# Patient Record
Sex: Male | Born: 1940 | Race: Black or African American | Hispanic: No | Marital: Married | Smoking: Former smoker
Health system: Southern US, Community
[De-identification: ages and names within clinical notes are randomized; demographics above are authoritative.]

## PROBLEM LIST (undated history)

## (undated) DIAGNOSIS — J449 Chronic obstructive pulmonary disease, unspecified: Secondary | ICD-10-CM

## (undated) DIAGNOSIS — C3491 Malignant neoplasm of unspecified part of right bronchus or lung: Principal | ICD-10-CM

## (undated) DIAGNOSIS — Z8719 Personal history of other diseases of the digestive system: Secondary | ICD-10-CM

## (undated) DIAGNOSIS — G4730 Sleep apnea, unspecified: Secondary | ICD-10-CM

## (undated) DIAGNOSIS — C801 Malignant (primary) neoplasm, unspecified: Secondary | ICD-10-CM

## (undated) DIAGNOSIS — Z9889 Other specified postprocedural states: Secondary | ICD-10-CM

## (undated) HISTORY — DX: Other specified postprocedural states: Z98.890

## (undated) HISTORY — PX: HYDROCELE EXCISION: SHX482

## (undated) HISTORY — DX: Personal history of other diseases of the digestive system: Z87.19

## (undated) HISTORY — PX: HERNIA REPAIR: SHX51

## (undated) HISTORY — DX: Chronic obstructive pulmonary disease, unspecified: J44.9

## (undated) HISTORY — DX: Malignant neoplasm of unspecified part of right bronchus or lung: C34.91

## (undated) HISTORY — PX: APPENDECTOMY: SHX54

## (undated) HISTORY — DX: Sleep apnea, unspecified: G47.30

---

## 2004-07-06 ENCOUNTER — Ambulatory Visit (HOSPITAL_COMMUNITY): Admission: RE | Admit: 2004-07-06 | Discharge: 2004-07-06 | Payer: Self-pay | Admitting: General Surgery

## 2007-07-14 ENCOUNTER — Ambulatory Visit: Payer: Self-pay | Admitting: Sports Medicine

## 2007-07-14 DIAGNOSIS — M533 Sacrococcygeal disorders, not elsewhere classified: Secondary | ICD-10-CM | POA: Insufficient documentation

## 2010-10-05 NOTE — Op Note (Signed)
NAMEYAFET, CLINE NO.:  [id number]   MEDICAL RECORD NO.:  [id number]          PATIENT TYPE:  OIB   LOCATION:  2899                         FACILITY:  [facility]   PHYSICIAN:  Leonie Man, M.D.   DATE OF BIRTH:  September 02, 1940   DATE OF PROCEDURE:  07/06/2004  DATE OF DISCHARGE:                                 OPERATIVE REPORT   PREOPERATIVE DIAGNOSIS:  Recurrent left inguinal hernia.   POSTOPERATIVE DIAGNOSIS:  Recurrent left inguinal hernia.   PROCEDURE:  Repair of left inguinal hernia, herniorrhaphy with mesh.   SURGEON:  Leonie Man, M.D.   ASSISTANT:  OR tech.   ANESTHESIA:  General.   The patient is a 70 year old gentleman who underwent a Shouldice-type  herniorrhaphy approximately 10 years ago. He returns now with recurrent  inguinal hernia and comes to the operating room for repair after the risks  and potential benefits of surgery have been fully discussed with him. He  understands and accepts these risks and gives consent surgery.   PROCEDURE:  Following induction of satisfactory general anesthesia, the  patient is positioned supinely. The abdomen is prepped and draped to be  included in the sterile operative field.  I infiltrated the left lower groin  crease with 0.5% Marcaine with epinephrine. A transverse incision in the old  scar cicatrix was carried down through skin, subcutaneous tissue dissecting  down to the external oblique aponeurosis.  This was clear down to the  external inguinal ring and the external inguinal ring is opened up to reveal  the spermatic cord. The spermatic cord was dissected free from the floor of  the inguinal canal and held with a Penrose drain.  The area of herniation  was at the internal ring. Where a large hernia was noted. This was reduced  back into the retroperitoneum and I placed a Bard plug of medium sized into  this defect and sutured it to the transversalis fascia. This was then  covered with an overlay  patch of polypropylene mesh which was sewn in at the  pubic tubercle, carried up along the conjoined tendon to the internal ring  and again from the pubic tubercle up along the shelving edge of Poupart's  ligament to the internal ring. The tails of mesh were trimmed and sutured  into this internal oblique muscle behind the cord. The hernia repair was  noted to be intact. Sponge and instrument counts were verified and the  external oblique aponeurosis was closed over the cord so as to reapproximate  the external ring. This was done with a 2-0 Vicryl suture. The subcutaneous  tissues and Scarpa's  fascia was then closed with 3-0 Vicryl suture after  sponge and instrument counts were verified.  The skin was closed with a running 4-0 Monocryl suture and then reinforced  Steri-Strips and sterile dressing was applied. The anesthetic reversed and  the patient removed from the operating room to the recovery room in stable  condition.  He tolerated the procedure well.      PB/MEDQ  D:  07/06/2004  T:  07/06/2004  Job:  [id number] 

## 2013-06-28 ENCOUNTER — Other Ambulatory Visit: Payer: Self-pay | Admitting: Internal Medicine

## 2013-06-28 DIAGNOSIS — J449 Chronic obstructive pulmonary disease, unspecified: Secondary | ICD-10-CM | POA: Diagnosis not present

## 2013-06-28 DIAGNOSIS — R05 Cough: Secondary | ICD-10-CM | POA: Diagnosis not present

## 2013-06-28 DIAGNOSIS — F17200 Nicotine dependence, unspecified, uncomplicated: Secondary | ICD-10-CM | POA: Diagnosis not present

## 2013-06-28 DIAGNOSIS — R059 Cough, unspecified: Secondary | ICD-10-CM | POA: Diagnosis not present

## 2013-06-28 DIAGNOSIS — Z23 Encounter for immunization: Secondary | ICD-10-CM | POA: Diagnosis not present

## 2013-07-02 ENCOUNTER — Ambulatory Visit
Admission: RE | Admit: 2013-07-02 | Discharge: 2013-07-02 | Disposition: A | Discharge status: Home / Self Care | Payer: Medicare Other | Source: Ambulatory Visit | Attending: Internal Medicine | Admitting: Internal Medicine

## 2013-07-02 DIAGNOSIS — J449 Chronic obstructive pulmonary disease, unspecified: Secondary | ICD-10-CM | POA: Diagnosis not present

## 2013-07-02 MED ORDER — IOHEXOL 300 MG/ML  SOLN
75.0000 mL | Freq: Once | INTRAMUSCULAR | Status: AC | PRN
  Administered 2013-07-02: 75 mL via INTRAVENOUS

## 2013-09-01 DIAGNOSIS — Z125 Encounter for screening for malignant neoplasm of prostate: Secondary | ICD-10-CM | POA: Diagnosis not present

## 2013-09-01 DIAGNOSIS — R911 Solitary pulmonary nodule: Secondary | ICD-10-CM | POA: Diagnosis not present

## 2013-09-01 DIAGNOSIS — Z0000 Encounter for general adult medical examination without abnormal findings: Secondary | ICD-10-CM | POA: Diagnosis not present

## 2013-09-01 DIAGNOSIS — Z23 Encounter for immunization: Secondary | ICD-10-CM | POA: Diagnosis not present

## 2013-09-01 DIAGNOSIS — Z1331 Encounter for screening for depression: Secondary | ICD-10-CM | POA: Diagnosis not present

## 2013-09-01 DIAGNOSIS — J438 Other emphysema: Secondary | ICD-10-CM | POA: Diagnosis not present

## 2013-09-01 DIAGNOSIS — F17200 Nicotine dependence, unspecified, uncomplicated: Secondary | ICD-10-CM | POA: Diagnosis not present

## 2013-10-14 ENCOUNTER — Other Ambulatory Visit: Payer: Self-pay | Admitting: Internal Medicine

## 2013-10-14 DIAGNOSIS — Z139 Encounter for screening, unspecified: Secondary | ICD-10-CM

## 2013-10-22 ENCOUNTER — Ambulatory Visit: Payer: Medicare Other

## 2013-10-26 ENCOUNTER — Ambulatory Visit
Admission: RE | Admit: 2013-10-26 | Discharge: 2013-10-26 | Disposition: A | Discharge status: Home / Self Care | Payer: Medicare Other | Source: Ambulatory Visit | Attending: Internal Medicine | Admitting: Internal Medicine

## 2013-10-26 ENCOUNTER — Ambulatory Visit: Payer: Medicare Other

## 2013-10-26 DIAGNOSIS — I708 Atherosclerosis of other arteries: Secondary | ICD-10-CM | POA: Diagnosis not present

## 2013-10-26 DIAGNOSIS — Z139 Encounter for screening, unspecified: Secondary | ICD-10-CM

## 2014-01-28 DIAGNOSIS — R209 Unspecified disturbances of skin sensation: Secondary | ICD-10-CM | POA: Diagnosis not present

## 2014-01-28 DIAGNOSIS — F17200 Nicotine dependence, unspecified, uncomplicated: Secondary | ICD-10-CM | POA: Diagnosis not present

## 2014-01-28 DIAGNOSIS — R5381 Other malaise: Secondary | ICD-10-CM | POA: Diagnosis not present

## 2014-01-28 DIAGNOSIS — T1490XA Injury, unspecified, initial encounter: Secondary | ICD-10-CM | POA: Diagnosis not present

## 2014-01-28 DIAGNOSIS — R5383 Other fatigue: Secondary | ICD-10-CM | POA: Diagnosis not present

## 2014-01-28 DIAGNOSIS — G5630 Lesion of radial nerve, unspecified upper limb: Secondary | ICD-10-CM | POA: Diagnosis not present

## 2014-06-25 DIAGNOSIS — J069 Acute upper respiratory infection, unspecified: Secondary | ICD-10-CM | POA: Diagnosis not present

## 2015-06-02 DIAGNOSIS — R350 Frequency of micturition: Secondary | ICD-10-CM | POA: Diagnosis not present

## 2015-06-02 DIAGNOSIS — J441 Chronic obstructive pulmonary disease with (acute) exacerbation: Secondary | ICD-10-CM | POA: Diagnosis not present

## 2015-06-02 DIAGNOSIS — R21 Rash and other nonspecific skin eruption: Secondary | ICD-10-CM | POA: Diagnosis not present

## 2016-10-22 ENCOUNTER — Other Ambulatory Visit: Payer: Self-pay | Admitting: Internal Medicine

## 2016-10-22 ENCOUNTER — Ambulatory Visit
Admission: RE | Admit: 2016-10-22 | Discharge: 2016-10-22 | Disposition: A | Discharge status: Home / Self Care | Payer: Medicare Other | Source: Ambulatory Visit | Attending: Internal Medicine | Admitting: Internal Medicine

## 2016-10-22 DIAGNOSIS — R042 Hemoptysis: Secondary | ICD-10-CM | POA: Diagnosis not present

## 2016-10-22 DIAGNOSIS — J449 Chronic obstructive pulmonary disease, unspecified: Secondary | ICD-10-CM

## 2016-10-22 DIAGNOSIS — R911 Solitary pulmonary nodule: Secondary | ICD-10-CM

## 2016-10-22 DIAGNOSIS — J90 Pleural effusion, not elsewhere classified: Secondary | ICD-10-CM | POA: Diagnosis not present

## 2016-10-22 MED ORDER — IOPAMIDOL (ISOVUE-300) INJECTION 61%
75.0000 mL | Freq: Once | INTRAVENOUS | Status: AC | PRN
  Administered 2016-10-22: 75 mL via INTRAVENOUS

## 2016-10-23 DIAGNOSIS — J449 Chronic obstructive pulmonary disease, unspecified: Secondary | ICD-10-CM | POA: Diagnosis not present

## 2016-10-23 DIAGNOSIS — R042 Hemoptysis: Secondary | ICD-10-CM | POA: Diagnosis not present

## 2016-10-23 DIAGNOSIS — R918 Other nonspecific abnormal finding of lung field: Secondary | ICD-10-CM | POA: Diagnosis not present

## 2016-11-07 ENCOUNTER — Telehealth: Payer: Self-pay | Admitting: *Deleted

## 2016-11-07 NOTE — Telephone Encounter (Signed)
Oncology Nurse Navigator Documentation  Oncology Nurse Navigator Flowsheets 11/07/2016  Navigator Location [facility]  Referral date to RadOnc/MedOnc 11/07/2016  Navigator Encounter Type Telephone/I updated Dr. Julien Nordmann on referral.  He asked that I call the patient to be scheduled on 11/19/16.  I called and left a vm message with my name and phone number to call.   Telephone Outgoing Call  Treatment Phase Abnormal Scans  Barriers/Navigation Needs Coordination of Care  Interventions Coordination of Care  Coordination of Care Appts  Acuity Level 1  Time Spent with Patient 15

## 2016-11-11 ENCOUNTER — Telehealth: Payer: Self-pay | Admitting: *Deleted

## 2016-11-11 DIAGNOSIS — R918 Other nonspecific abnormal finding of lung field: Secondary | ICD-10-CM | POA: Insufficient documentation

## 2016-11-11 NOTE — Telephone Encounter (Signed)
Oncology Nurse Navigator Documentation  Oncology Nurse Navigator Flowsheets 11/11/2016  Navigator Location [facility]  Navigator Encounter Type Telephone/I received a call back. I updated on appt with Dr. Julien Nordmann.   Telephone Incoming Call  Treatment Phase Abnormal Scans  Barriers/Navigation Needs Coordination of Care  Interventions Coordination of Care  Coordination of Care Appts  Acuity Level 1  Time Spent with Patient 15

## 2016-11-11 NOTE — Telephone Encounter (Signed)
Oncology Nurse Navigator Documentation  Oncology Nurse Navigator Flowsheets 11/11/2016  Navigator Location [facility]  Navigator Encounter Type Telephone/I received a call from Mr. Hoh.  I called him back today and was unable to reach him.  I left vm message with my name and phone number to call.   Telephone Incoming Call;Outgoing Call  Treatment Phase Abnormal Scans  Barriers/Navigation Needs Coordination of Care  Interventions Coordination of Care  Coordination of Care Appts  Acuity Level 1  Time Spent with Patient 15

## 2016-11-18 DIAGNOSIS — R918 Other nonspecific abnormal finding of lung field: Secondary | ICD-10-CM | POA: Diagnosis not present

## 2016-11-18 DIAGNOSIS — G4733 Obstructive sleep apnea (adult) (pediatric): Secondary | ICD-10-CM | POA: Diagnosis not present

## 2016-11-19 ENCOUNTER — Telehealth: Payer: Self-pay | Admitting: Internal Medicine

## 2016-11-19 ENCOUNTER — Ambulatory Visit (HOSPITAL_BASED_OUTPATIENT_CLINIC_OR_DEPARTMENT_OTHER): Payer: Medicare Other | Admitting: Internal Medicine

## 2016-11-19 ENCOUNTER — Encounter: Payer: Self-pay | Admitting: Internal Medicine

## 2016-11-19 ENCOUNTER — Encounter: Payer: Self-pay | Admitting: *Deleted

## 2016-11-19 ENCOUNTER — Other Ambulatory Visit (HOSPITAL_BASED_OUTPATIENT_CLINIC_OR_DEPARTMENT_OTHER): Payer: Medicare Other

## 2016-11-19 VITALS — BP 120/69 | HR 82 | Temp 97.8°F | Resp 19 | Ht 73.0 in | Wt 140.3 lb

## 2016-11-19 DIAGNOSIS — R918 Other nonspecific abnormal finding of lung field: Secondary | ICD-10-CM | POA: Diagnosis not present

## 2016-11-19 DIAGNOSIS — R042 Hemoptysis: Secondary | ICD-10-CM

## 2016-11-19 LAB — COMPREHENSIVE METABOLIC PANEL
ALBUMIN: 3.5 g/dL (ref 3.5–5.0)
ALK PHOS: 87 U/L (ref 40–150)
ALT: 6 U/L (ref 0–55)
ANION GAP: 9 meq/L (ref 3–11)
AST: 15 U/L (ref 5–34)
BILIRUBIN TOTAL: 0.32 mg/dL (ref 0.20–1.20)
BUN: 13.5 mg/dL (ref 7.0–26.0)
CALCIUM: 10 mg/dL (ref 8.4–10.4)
CO2: 28 mEq/L (ref 22–29)
CREATININE: 0.9 mg/dL (ref 0.7–1.3)
Chloride: 103 mEq/L (ref 98–109)
EGFR: >90 mL/min/{1.73_m2} (ref >90)
Glucose: 86 mg/dl (ref 70–140)
Potassium: 4.8 mEq/L (ref 3.5–5.1)
Sodium: 141 mEq/L (ref 136–145)
TOTAL PROTEIN: 8.2 g/dL (ref 6.4–8.3)

## 2016-11-19 LAB — CBC WITH DIFFERENTIAL/PLATELET
BASO%: 0.4 % (ref 0.0–2.0)
Basophils Absolute: 0 10*3/uL (ref 0.0–0.1)
EOS%: 3.8 % (ref 0.0–7.0)
Eosinophils Absolute: 0.4 10*3/uL (ref 0.0–0.5)
HEMATOCRIT: 41.3 % (ref 38.4–49.9)
HEMOGLOBIN: 13.7 g/dL (ref 13.0–17.1)
LYMPH#: 2 10*3/uL (ref 0.9–3.3)
LYMPH%: 17.7 % (ref 14.0–49.0)
MCH: 29.7 pg (ref 27.2–33.4)
MCHC: 33.2 g/dL (ref 32.0–36.0)
MCV: 89.6 fL (ref 79.3–98.0)
MONO#: 1.1 10*3/uL — AB (ref 0.1–0.9)
MONO%: 9.8 % (ref 0.0–14.0)
NEUT#: 7.6 10*3/uL — ABNORMAL HIGH (ref 1.5–6.5)
NEUT%: 68.3 % (ref 39.0–75.0)
PLATELETS: 368 10*3/uL (ref 140–400)
RBC: 4.61 10*6/uL (ref 4.20–5.82)
RDW: 13.3 % (ref 11.0–14.6)
WBC: 11.1 10*3/uL — ABNORMAL HIGH (ref 4.0–10.3)

## 2016-11-19 NOTE — Telephone Encounter (Signed)
Scheduled appt per 7/3 los - Gave patient AVS and calender per los.  

## 2016-11-19 NOTE — Progress Notes (Signed)
Oncology Nurse Navigator Documentation  Oncology Nurse Navigator Flowsheets 11/19/2016  Navigator Location [facility]  Navigator Encounter Type Other/per Dr. Julien Nordmann, I contacted [facility] to updated them on referral.  Treatment Phase Abnormal Scans  Barriers/Navigation Needs Coordination of Care  Interventions Coordination of Care  Coordination of Care Other  Acuity Level 1  Time Spent with Patient 15

## 2016-11-19 NOTE — Progress Notes (Signed)
[place] [phone]   [phone]  CONSULT NOTE  REFERRING PHYSICIAN: Dr. Jori Allen polite  REASON FOR CONSULTATION:  76 years old African-American male with highly suspicious lung cancer  HPI Harry Allen is a 76 y.o. male with past medical history significant for COPD, sleep apnea as well as hernia repair. The patient also has a long history of smoking but quit in 2015. The patient has been complaining of chronic cough with blood-tinged sputum since early May 2018. He was seen by his primary care physician at that time Dr. Delfina Allen. He ordered CT scan of the chest which was performed on 10/22/2016 and it showed a mass in the superior segment of the right lower lobe which surrounds the superior segment of the right lower lobe bronchus and potentially may invade this bronchus. This mass measured 6.4 x 5.3 x 5.2 cm and extends into and invades the right hilum. There may be adjacent hilar adenopathy. There was also a nodular lesion in the apical segment of the right upper lobe measuring 1.2 x 0.8 cm. This scan also showed adenopathy anterior to the right main bronchus measuring 2.0 x 1.6 cm. There was a prominent lymph node immediately anterior to the carina measuring 1.6 x 1.1 cm and then maybe hilar adenopathy. The patient has been traveling between [place] and [place] for few weeks for some business. He was referred to the clinic today by Dr. Delfina Allen for evaluation of his condition. In the meantime he changed his primary care physician and he is currently seeing Dr. Melford Allen. When seen today he continues to complain of generalized weakness as well as shortness breath with exertion and cough productive of blood-tinged sputum but no significant chest pain. He denied having any weight loss or night sweats. He has no headache or visual changes. He has no nausea, vomiting, diarrhea or constipation. Family history significant for mother died from old age at 65 and  father had cancer in his 32s. Brother and sister had heart disease. The patient is married and has no children. He was accompanied today by his wife Harry Allen. Is currently retired and used to Retail buyer at [employer]. The patient has a history of smoking 1 pack per day for around 50 years and quit 3 years ago. He also drinks alcohol occasionally and no history of drug abuse.  HPI  Past Medical History:  Diagnosis Date  . COPD (chronic obstructive pulmonary disease) ([place])   . H/O hernia repair   . Sleep apnea     History reviewed. No pertinent surgical history.  Family History  Problem Relation Age of Onset  . Cancer Father     Social History Social History  Substance Use Topics  . Smoking status: Former Smoker    Packs/day: 1.00    Years: 8.00    Quit date: 11/19/2013  . Smokeless tobacco: Never Used  . Alcohol use Not on file    Not on File  Current Outpatient Prescriptions  Medication Sig Dispense Refill  . omeprazole (PRILOSEC) 20 MG capsule Take by mouth.     No current facility-administered medications for this visit.     Review of Systems  Constitutional: positive for fatigue Eyes: negative Ears, nose, mouth, throat, and face: negative Respiratory: positive for cough, dyspnea on exertion, hemoptysis and sputum Cardiovascular: negative Gastrointestinal: negative Genitourinary:negative Integument/breast: negative Hematologic/lymphatic: negative Musculoskeletal:negative Neurological: negative Behavioral/Psych: negative Endocrine: negative Allergic/Immunologic: negative  Physical Exam  XUX:YBFXO, healthy, no distress, well nourished, well developed and  anxious SKIN: skin color, texture, turgor are normal, no rashes or significant lesions HEAD: Normocephalic, No masses, lesions, tenderness or abnormalities EYES: normal, PERRLA, Conjunctiva are pink and non-injected EARS: External ears normal, Canals clear OROPHARYNX:no exudate, no erythema and lips, buccal  mucosa, and tongue normal  NECK: supple, no adenopathy, no JVD LYMPH:  no palpable lymphadenopathy, no hepatosplenomegaly LUNGS: clear to auscultation , and palpation HEART: regular rate & rhythm, no murmurs and no gallops ABDOMEN:abdomen soft, non-tender, normal bowel sounds and no masses or organomegaly BACK: Back symmetric, no curvature., No CVA tenderness EXTREMITIES:no joint deformities, effusion, or inflammation, no edema, no skin discoloration  NEURO: alert & oriented x 3 with fluent speech, no focal motor/sensory deficits  PERFORMANCE STATUS: ECOG 1  LABORATORY DATA: Lab Results  Component Value Date   WBC 11.1 (H) 11/19/2016   HGB 13.7 11/19/2016   HCT 41.3 11/19/2016   MCV 89.6 11/19/2016   PLT 368 11/19/2016      Chemistry      Component Value Date/Time   NA 141 11/19/2016 1344   K 4.8 11/19/2016 1344   CO2 28 11/19/2016 1344   BUN 13.5 11/19/2016 1344   CREATININE 0.9 11/19/2016 1344      Component Value Date/Time   CALCIUM 10.0 11/19/2016 1344   ALKPHOS 87 11/19/2016 1344   AST 15 11/19/2016 1344   ALT 6 11/19/2016 1344   BILITOT 0.32 11/19/2016 1344       RADIOGRAPHIC STUDIES: Ct Chest W Contrast  Result Date: 10/22/2016 CLINICAL DATA:  Chronic hemoptysis.  Previous pulmonary nodule EXAM: CT CHEST WITH CONTRAST TECHNIQUE: Multidetector CT imaging of the chest was performed during intravenous contrast administration. CONTRAST:  12mL ISOVUE-300 IOPAMIDOL (ISOVUE-300) INJECTION 61% COMPARISON:  July 02, 2013 FINDINGS: Cardiovascular: There is no appreciable thoracic aortic aneurysm or dissection. There is atherosclerotic calcification, moderate, at the origin of the left subclavian artery. Milder atherosclerotic calcification is noted in the proximal left and right common carotid arteries. Visualized great vessels otherwise appear unremarkable. There are scattered foci of atherosclerotic calcification in the aorta as well as multiple foci of coronary artery  calcification. Pericardium is not appreciably thickened. There is no major vessel pulmonary embolus. Mediastinum/Nodes: Thyroid appears unremarkable. There is adenopathy anterior to the right main bronchus measuring 2.0 x 1.6 cm. There is a prominent lymph node immediately anterior to the carina measuring 1.6 x 1.1 cm. There may be hilar adenopathy indistinguishable from adjacent mass. There is a small hiatal hernia. Lungs/Pleura: There is a mass in the superior segment of the right lower lobe which surrounds the superior segment right lower lobe bronchus and potentially may invade this bronchus. This mass, which measures 6.4 x 5.3 x 5.2 cm, extends into and invades the right hilum. There may be adjacent hilar adenopathy. There is underlying centrilobular emphysematous change. There is a nodular lesion in the apical segment of the right upper lobe measuring 1.2 x 0.8 cm, not present on prior study. There are scattered areas of scarring throughout the lungs bilaterally. Bullae are noted in the apices, more pronounced on the right than on the left. There is no pleural effusion or pleural thickening. Upper Abdomen: No adrenal lesions are evident. There is a cyst arising from the periphery of the mid right kidney measuring 2.9 x 2.8 cm. There is aortic atherosclerosis in the upper abdominal region. Visualized upper abdominal structures otherwise appear unremarkable. Musculoskeletal: There are no blastic or lytic bone lesions. 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 IMPRESSION: Mass arising in the superior  segment right lower lobe extending into and likely invading the right hilum measuring 6.4 x 5.3 x 5.2 cm. Smaller mass, a likely metastasis, in the apical segment of the right upper lobe measuring 1.2 x 0.8 cm. Adenopathy anterior to the carina and anterior right main bronchus. There is probable hilar adenopathy on the right indistinguishable from the mass which is invading the right hilum. Underlying emphysema. Areas of  bullous disease in the apices. Scattered areas of scarring bilaterally. Areas of atherosclerotic calcification including foci of coronary artery calcification. Nuclear medicine PET study could be helpful for staging in this circumstance. These results will be called to the ordering clinician or representative by the Radiologist Assistant, and communication documented in the PACS or zVision Dashboard. Electronically Signed   By: Lowella Grip III M.D.   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   PLAN: I had a lengthy discussion with the patient and his wife today about his current disease stage, prognosis and treatment options. I personally and independently reviewed the scan images and discuss the results and showed the images to the patient and his wife today. I recommended for the patient to complete the staging workup by ordering a PET scan as well as MRI of the brain to rule out any other metastatic disease. I will also refer the patient to cardiothoracic surgery for consideration of bronchoscopy with endobronchial ultrasound and biopsy for tissue diagnosis and also to evaluate the source of his hemoptysis. I will see the patient back for follow-up visit in 2 weeks for reevaluation and more detailed discussion of his treatment options based on the final staging workup and tissue diagnosis. We will send the tissue biopsy for molecular studies based on the final pathology. The patient was advised to call immediately if she has any concerning symptoms in the interval. The patient voices understanding of current disease status and treatment options and is in agreement with the current care plan. All  questions were answered. The patient knows to call the clinic with any problems, questions or concerns. We can certainly see the patient much sooner if necessary.  Thank you so much for allowing me to participate in the care of [facility]. I will continue to follow up the patient with you and assist in his care.  I spent 40 minutes counseling the patient face to face. The total time spent in the appointment was 60 minutes.  Disclaimer: This note was dictated with voice recognition software. Similar sounding words can inadvertently be transcribed and may not be corrected upon review.   Deshon Koslowski K. November 19, 2016, 2:32 PM

## 2016-11-29 ENCOUNTER — Other Ambulatory Visit: Payer: Self-pay | Admitting: *Deleted

## 2016-11-29 ENCOUNTER — Institutional Professional Consult (permissible substitution) (INDEPENDENT_AMBULATORY_CARE_PROVIDER_SITE_OTHER): Payer: Medicare Other | Admitting: Thoracic Surgery (Cardiothoracic Vascular Surgery)

## 2016-11-29 ENCOUNTER — Encounter: Payer: Self-pay | Admitting: Thoracic Surgery (Cardiothoracic Vascular Surgery)

## 2016-11-29 VITALS — BP 126/76 | HR 68 | Resp 20 | Ht 72.0 in | Wt 142.0 lb

## 2016-11-29 DIAGNOSIS — R918 Other nonspecific abnormal finding of lung field: Secondary | ICD-10-CM

## 2016-11-29 NOTE — Progress Notes (Signed)
PCP is [name], MD Referring Provider is Curt Bears, MD  Chief Complaint  Patient presents with  . Lung Lesion    surgical eval for tissue BX, Chest CT 10/22/16 PET Scan and MR Brain scheduled for 12/05/16    HPI: Mr. Ybarra is a 76 year old gentleman who presents with chief complaint of hemoptysis.  Mr. Zobrist is a 76 year old man with a 40+-pack-year history of tobacco abuse and self diagnosed COPD. He also has a history of sleep apnea but does not use CPAP because it is cumbersome. About a month and a half ago he started coughing up small amounts of blood. She did not seek medical attention initially but when his wife found out she insisted that he see his doctor. A CT of the chest was done on 10/22/2016. It showed a large mass in the superior segment of the right lower lobe possibly invading the hilum. There also was a smaller right upper lobe nodule. Hilar or mediastinal adenopathy was noted as well.  He has not had any change in appetite but has lost 3 pounds over the past 3 months. He gets short of breath with heavy exertion but rides a bike on a regular basis. He self medicates for heartburn with Prilosec. He takes that once every couple of weeks. He has not had any chest pain, pressure, or tightness. He denies headaches or visual changes.  Zubrod Score: At the time of surgery this patient's most appropriate activity status/level should be described as: [x]     0    Normal activity, no symptoms []     1    Restricted in physical strenuous activity but ambulatory, able to do out light work []     2    Ambulatory and capable of self care, unable to do work activities, up and about >50 % of waking hours                              []     3    Only limited self care, in bed greater than 50% of waking hours []     4    Completely disabled, no self care, confined to bed or chair []     5    Moribund  Past Medical History:  Diagnosis Date  . COPD (chronic obstructive pulmonary disease)  ([place])   . H/O hernia repair   . Sleep apnea     Past Surgical History:  Procedure Laterality Date  . APPENDECTOMY    . HERNIA REPAIR      Family History  Problem Relation Age of Onset  . Cancer Father     Social History Social History  Substance Use Topics  . Smoking status: Former Smoker    Packs/day: 1.00    Years: 8.00    Quit date: 11/19/2013  . Smokeless tobacco: Never Used  . Alcohol use Not on file    Current Outpatient Prescriptions  Medication Sig Dispense Refill  . omeprazole (PRILOSEC) 20 MG capsule Take by mouth.     No current facility-administered medications for this visit.     No Known Allergies  Review of Systems  Constitutional: Positive for unexpected weight change (Has lost 3 pounds in 3 months). Negative for activity change.  HENT: Negative for trouble swallowing and voice change.   Respiratory: Positive for apnea, cough (Hemoptysis) and shortness of breath (Heavy exertion).   Gastrointestinal: Positive for abdominal pain (Occasional reflux). Negative for abdominal  distention and blood in stool.  Genitourinary: Positive for frequency. Negative for dysuria.  Musculoskeletal: Negative for arthralgias, back pain and gait problem.  Neurological: Negative for dizziness, syncope and weakness.  Hematological: Negative for adenopathy. Does not bruise/bleed easily.  All other systems reviewed and are negative.   BP 126/76   Pulse 68   Resp 20   Ht 6' (1.829 m)   Wt 142 lb (64.4 kg)   SpO2 94% Comment: RA  BMI 19.26 kg/m  Physical Exam  Constitutional: He is oriented to person, place, and time. He appears well-developed and well-nourished. No distress.  HENT:  Head: Normocephalic and atraumatic.  Mouth/Throat: No oropharyngeal exudate.  Eyes: Conjunctivae and EOM are normal. No scleral icterus.  Neck: Neck supple. No thyromegaly present.  Cardiovascular: Normal rate, regular rhythm, normal heart sounds and intact distal pulses.  Exam reveals no  gallop and no friction rub.   No murmur heard. Pulmonary/Chest: Effort normal and breath sounds normal. No respiratory distress. He has no wheezes. He has no rales.  Abdominal: Soft. He exhibits no distension. There is no tenderness.  Musculoskeletal: He exhibits no edema.  Lymphadenopathy:    He has no cervical adenopathy.  Neurological: He is alert and oriented to person, place, and time. No cranial nerve deficit. He exhibits normal muscle tone.  Skin: Skin is warm and dry.  Vitals reviewed.    Diagnostic Tests: CT CHEST WITH CONTRAST  TECHNIQUE: Multidetector CT imaging of the chest was performed during intravenous contrast administration.  CONTRAST:  68mL ISOVUE-300 IOPAMIDOL (ISOVUE-300) INJECTION 61%  COMPARISON:  July 02, 2013  FINDINGS: Cardiovascular: There is no appreciable thoracic aortic aneurysm or dissection. There is atherosclerotic calcification, moderate, at the origin of the left subclavian artery. Milder atherosclerotic calcification is noted in the proximal left and right common carotid arteries. Visualized great vessels otherwise appear unremarkable. There are scattered foci of atherosclerotic calcification in the aorta as well as multiple foci of coronary artery calcification. Pericardium is not appreciably thickened. There is no major vessel pulmonary embolus.  Mediastinum/Nodes: Thyroid appears unremarkable. There is adenopathy anterior to the right main bronchus measuring 2.0 x 1.6 cm. There is a prominent lymph node immediately anterior to the carina measuring 1.6 x 1.1 cm. There may be hilar adenopathy indistinguishable from adjacent mass. There is a small hiatal hernia.  Lungs/Pleura: There is a mass in the superior segment of the right lower lobe which surrounds the superior segment right lower lobe bronchus and potentially may invade this bronchus. This mass, which measures 6.4 x 5.3 x 5.2 cm, extends into and invades the  right hilum. There may be adjacent hilar adenopathy.  There is underlying centrilobular emphysematous change. There is a nodular lesion in the apical segment of the right upper lobe measuring 1.2 x 0.8 cm, not present on prior study. There are scattered areas of scarring throughout the lungs bilaterally. Bullae are noted in the apices, more pronounced on the right than on the left. There is no pleural effusion or pleural thickening.  Upper Abdomen: No adrenal lesions are evident. There is a cyst arising from the periphery of the mid right kidney measuring 2.9 x 2.8 cm. There is aortic atherosclerosis in the upper abdominal region. Visualized upper abdominal structures otherwise appear unremarkable.  Musculoskeletal: There are no blastic or lytic bone lesions. 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  IMPRESSION: Mass arising in the superior segment right lower lobe extending into and likely invading the right hilum measuring 6.4 x 5.3 x 5.2 cm.  Smaller mass, a likely metastasis, in the apical segment of the right upper lobe measuring 1.2 x 0.8 cm.  Adenopathy anterior to the carina and anterior right main bronchus. There is probable hilar adenopathy on the right indistinguishable from the mass which is invading the right hilum.  Underlying emphysema. Areas of bullous disease in the apices. Scattered areas of scarring bilaterally.  Areas of atherosclerotic calcification including foci of coronary artery calcification.  Nuclear medicine PET study could be helpful for staging in this circumstance.  These results will be called to the ordering clinician or representative by the Radiologist Assistant, and communication documented in the PACS or zVision Dashboard.   Electronically Signed   By: Lowella Grip III M.D.   On: 10/22/2016 13:52 I personally reviewed the CT chest and concur with the findings noted above.  Impression: Mr. Shaneyfelt is a 76 year old  gentleman with a history of tobacco abuse who presented with hemoptysis and was found to have a large mass (greater than 6 cm) in the superior segment of the right lower lobe. There also was a smaller right upper lobe nodule hilar adenopathy and moderate mediastinal adenopathy. 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  He needs biopsy for diagnosis and staging. He is to have a PET scan on the 18th (5 days from now). I will be out of town next week but will be able to do a biopsy the following week. I recommended that we proceed with bronchoscopy and endobronchial ultrasound for diagnosis and staging. He understands this is not therapeutic. We will do this in the operating room under general anesthesia on an outpatient basis. I discussed the general nature of the procedure with the patient and his wife. They understand there is no guarantee of a definitive diagnosis with this type of biopsy. I informed him of the indications, risks, benefits, and alternatives. They understand the risk include those associated with procedures done under general anesthesia up to and including MI, stroke, blood clots, or death. Procedure specific risks include bleeding, pneumothorax, and failure to establish a definitive diagnosis.  He understands and accepts the risk and agrees to proceed.  Plan: Bronchoscopy and endobronchial ultrasound on Monday, 12/09/2016  [name], MD [facility] [phone]

## 2016-12-02 ENCOUNTER — Telehealth: Payer: Self-pay | Admitting: *Deleted

## 2016-12-02 NOTE — Telephone Encounter (Signed)
Call from pt's wife asking to be contacted with sooner appointment if available prior to 8/2. Pt has biopsy scheduled for 7/23. Informed her it takes a few days for biopsy results. She voiced understanding. Please call with sooner, afternoon appt if available.

## 2016-12-03 NOTE — Telephone Encounter (Signed)
I told family member there is not an appt available before 8/2.

## 2016-12-04 ENCOUNTER — Encounter (HOSPITAL_COMMUNITY): Payer: Self-pay

## 2016-12-04 ENCOUNTER — Encounter (HOSPITAL_COMMUNITY)
Admission: RE | Admit: 2016-12-04 | Discharge: 2016-12-04 | Disposition: A | Discharge status: Home / Self Care | Payer: Medicare Other | Source: Ambulatory Visit | Attending: Thoracic Surgery (Cardiothoracic Vascular Surgery) | Admitting: Thoracic Surgery (Cardiothoracic Vascular Surgery)

## 2016-12-04 DIAGNOSIS — Z01812 Encounter for preprocedural laboratory examination: Secondary | ICD-10-CM

## 2016-12-04 DIAGNOSIS — I6782 Cerebral ischemia: Secondary | ICD-10-CM | POA: Diagnosis not present

## 2016-12-04 DIAGNOSIS — J439 Emphysema, unspecified: Secondary | ICD-10-CM | POA: Diagnosis not present

## 2016-12-04 DIAGNOSIS — I2510 Atherosclerotic heart disease of native coronary artery without angina pectoris: Secondary | ICD-10-CM | POA: Diagnosis not present

## 2016-12-04 DIAGNOSIS — R918 Other nonspecific abnormal finding of lung field: Secondary | ICD-10-CM

## 2016-12-04 DIAGNOSIS — R911 Solitary pulmonary nodule: Secondary | ICD-10-CM | POA: Diagnosis not present

## 2016-12-04 DIAGNOSIS — M4185 Other forms of scoliosis, thoracolumbar region: Secondary | ICD-10-CM | POA: Diagnosis not present

## 2016-12-04 DIAGNOSIS — J328 Other chronic sinusitis: Secondary | ICD-10-CM | POA: Diagnosis not present

## 2016-12-04 HISTORY — DX: Malignant (primary) neoplasm, unspecified: C80.1

## 2016-12-04 LAB — CBC
HEMATOCRIT: 38.9 % — AB (ref 39.0–52.0)
Hemoglobin: 13.1 g/dL (ref 13.0–17.0)
MCH: 29.6 pg (ref 26.0–34.0)
MCHC: 33.7 g/dL (ref 30.0–36.0)
MCV: 88 fL (ref 78.0–100.0)
Platelets: 341 10*3/uL (ref 150–400)
RBC: 4.42 MIL/uL (ref 4.22–5.81)
RDW: 12.7 % (ref 11.5–15.5)
WBC: 10.4 10*3/uL (ref 4.0–10.5)

## 2016-12-04 LAB — PROTIME-INR
INR: 0.92
PROTHROMBIN TIME: 12.3 s (ref 11.4–15.2)

## 2016-12-04 LAB — COMPREHENSIVE METABOLIC PANEL
ALT: 10 U/L — AB (ref 17–63)
AST: 17 U/L (ref 15–41)
Albumin: 3.5 g/dL (ref 3.5–5.0)
Alkaline Phosphatase: 72 U/L (ref 38–126)
Anion gap: 5 (ref 5–15)
BILIRUBIN TOTAL: 0.7 mg/dL (ref 0.3–1.2)
BUN: 12 mg/dL (ref 6–20)
CALCIUM: 9.2 mg/dL (ref 8.9–10.3)
CO2: 27 mmol/L (ref 22–32)
CREATININE: 0.87 mg/dL (ref 0.61–1.24)
Chloride: 102 mmol/L (ref 101–111)
Glucose, Bld: 99 mg/dL (ref 65–99)
Potassium: 4.1 mmol/L (ref 3.5–5.1)
Sodium: 134 mmol/L — ABNORMAL LOW (ref 135–145)
TOTAL PROTEIN: 7.4 g/dL (ref 6.5–8.1)

## 2016-12-04 LAB — APTT: aPTT: 35 seconds (ref 24–36)

## 2016-12-04 NOTE — Pre-Procedure Instructions (Addendum)
Harry Allen  12/04/2016      [facility] [address] [place], [place] [address] Phone: [phone] Fax: [phone]    Your procedure is scheduled on Monday, December 09, 2016.  Report to [facility] Admitting at 12:00 P.M.  Call this number if you have problems the morning of surgery:  [phone]   Remember:  Do not eat food or drink liquids after midnight.  Take these medicines the morning of surgery with A SIP OF WATER: omeprazole (Prilosec) - if needed  7 days prior to surgery STOP taking any Aspirin, Aleve, Naproxen, Ibuprofen, Motrin, Advil, Goody's, BC's, all herbal medications, fish oil, and all vitamins    Do not wear jewelry  Do not wear lotions, powders, or colognes, or deodorant.  Men may shave face and neck.  Do not bring valuables to the hospital.  [facility] is not responsible for any belongings or valuables.  Contacts, dentures or bridgework may not be worn into surgery.  Leave your suitcase in the car.  After surgery it may be brought to your room.  For patients admitted to the hospital, discharge time will be determined by your treatment team.  Patients discharged the day of surgery will not be allowed to drive home.   Name and phone number of your driver:    Special instructions:   [place]- Preparing For Surgery  Before surgery, you can play an important role. Because skin is not sterile, your skin needs to be as free of germs as possible. You can reduce the number of germs on your skin by washing with CHG (chlorahexidine gluconate) Soap before surgery.  CHG is an antiseptic cleaner which kills germs and bonds with the skin to continue killing germs even after washing.  Please do not use if you have an allergy to CHG or antibacterial soaps. If your skin becomes reddened/irritated stop using the CHG.  Do not shave (including legs and underarms) for at least 48 hours prior to  first CHG shower. It is OK to shave your face.  Please follow these instructions carefully.   1. Shower the NIGHT BEFORE SURGERY and the MORNING OF SURGERY with CHG.   2. If you chose to wash your hair, wash your hair first as usual with your normal shampoo.  3. After you shampoo, rinse your hair and body thoroughly to remove the shampoo.  4. Use CHG as you would any other liquid soap. You can apply CHG directly to the skin and wash gently with a scrungie or a clean washcloth.   5. Apply the CHG Soap to your body ONLY FROM THE NECK DOWN.  Do not use on open wounds or open sores. Avoid contact with your eyes, ears, mouth and genitals (private parts). Wash genitals (private parts) with your normal soap.  6. Wash thoroughly, paying special attention to the area where your surgery will be performed.  7. Thoroughly rinse your body with warm water from the neck down.  8. DO NOT shower/wash with your normal soap after using and rinsing off the CHG Soap.  9. Pat yourself dry with a CLEAN TOWEL.   10. Wear CLEAN PAJAMAS   11. Place CLEAN SHEETS on your bed the night of your first shower and DO NOT SLEEP WITH PETS.    Day of Surgery:  Shower as stated above Do not apply any deodorants/lotions/colognes/powders. Please wear clean clothes to the hospital/surgery center.  Please read over the following fact sheets that you were given.

## 2016-12-04 NOTE — Progress Notes (Signed)
PCP - Dr. Melford Aase Cardiologist - patient denies  Chest x-ray - to be done on DOS EKG - n/a Stress Test - patient denies ECHO - patient denies Cardiac Cath - patient denies  Sleep Study - patient stated he had 2 done 10+ years ago in [place] CPAP - does not tolerate face mask so has never worn    Patient denies shortness of breath, fever, cough and chest pain at PAT appointment   Patient verbalized understanding of instructions that were given to them at the PAT appointment. Patient was also instructed that they will need to review over the PAT instructions again at home before surgery.

## 2016-12-05 ENCOUNTER — Ambulatory Visit (HOSPITAL_COMMUNITY)
Admission: RE | Admit: 2016-12-05 | Discharge: 2016-12-05 | Disposition: A | Discharge status: Home / Self Care | Payer: Medicare Other | Source: Ambulatory Visit | Attending: Internal Medicine | Admitting: Internal Medicine

## 2016-12-05 DIAGNOSIS — J439 Emphysema, unspecified: Secondary | ICD-10-CM | POA: Diagnosis not present

## 2016-12-05 DIAGNOSIS — I6782 Cerebral ischemia: Secondary | ICD-10-CM | POA: Insufficient documentation

## 2016-12-05 DIAGNOSIS — I2510 Atherosclerotic heart disease of native coronary artery without angina pectoris: Secondary | ICD-10-CM | POA: Diagnosis not present

## 2016-12-05 DIAGNOSIS — R918 Other nonspecific abnormal finding of lung field: Secondary | ICD-10-CM | POA: Insufficient documentation

## 2016-12-05 DIAGNOSIS — R911 Solitary pulmonary nodule: Secondary | ICD-10-CM | POA: Insufficient documentation

## 2016-12-05 DIAGNOSIS — J328 Other chronic sinusitis: Secondary | ICD-10-CM | POA: Diagnosis not present

## 2016-12-05 DIAGNOSIS — M4185 Other forms of scoliosis, thoracolumbar region: Secondary | ICD-10-CM | POA: Insufficient documentation

## 2016-12-05 DIAGNOSIS — J329 Chronic sinusitis, unspecified: Secondary | ICD-10-CM | POA: Diagnosis not present

## 2016-12-05 LAB — GLUCOSE, CAPILLARY: Glucose-Capillary: 89 mg/dL (ref 65–99)

## 2016-12-05 MED ORDER — GADOBENATE DIMEGLUMINE 529 MG/ML IV SOLN
15.0000 mL | Freq: Once | INTRAVENOUS | Status: AC | PRN
  Administered 2016-12-05: 12 mL via INTRAVENOUS

## 2016-12-05 MED ORDER — FLUDEOXYGLUCOSE F - 18 (FDG) INJECTION
7.6000 | Freq: Once | INTRAVENOUS | Status: AC | PRN
  Administered 2016-12-05: 7.6 via INTRAVENOUS

## 2016-12-09 ENCOUNTER — Ambulatory Visit (HOSPITAL_COMMUNITY)
Admission: RE | Admit: 2016-12-09 | Discharge: 2016-12-09 | Disposition: A | Discharge status: Home / Self Care | Payer: Medicare Other | Source: Ambulatory Visit | Attending: Thoracic Surgery (Cardiothoracic Vascular Surgery) | Admitting: Thoracic Surgery (Cardiothoracic Vascular Surgery)

## 2016-12-09 ENCOUNTER — Ambulatory Visit (HOSPITAL_COMMUNITY): Payer: Medicare Other

## 2016-12-09 ENCOUNTER — Other Ambulatory Visit (HOSPITAL_COMMUNITY): Payer: Self-pay | Admitting: *Deleted

## 2016-12-09 ENCOUNTER — Encounter (HOSPITAL_COMMUNITY)
Admission: RE | Disposition: A | Discharge status: Home / Self Care | Payer: Self-pay | Source: Ambulatory Visit | Attending: Thoracic Surgery (Cardiothoracic Vascular Surgery)

## 2016-12-09 ENCOUNTER — Encounter (HOSPITAL_COMMUNITY): Payer: Self-pay | Admitting: *Deleted

## 2016-12-09 ENCOUNTER — Telehealth: Payer: Self-pay | Admitting: *Deleted

## 2016-12-09 ENCOUNTER — Ambulatory Visit (HOSPITAL_COMMUNITY): Payer: Medicare Other | Admitting: Certified Registered Nurse Anesthetist

## 2016-12-09 DIAGNOSIS — Z9119 Patient's noncompliance with other medical treatment and regimen: Secondary | ICD-10-CM | POA: Insufficient documentation

## 2016-12-09 DIAGNOSIS — G4730 Sleep apnea, unspecified: Secondary | ICD-10-CM | POA: Diagnosis not present

## 2016-12-09 DIAGNOSIS — C3431 Malignant neoplasm of lower lobe, right bronchus or lung: Secondary | ICD-10-CM | POA: Diagnosis not present

## 2016-12-09 DIAGNOSIS — J449 Chronic obstructive pulmonary disease, unspecified: Secondary | ICD-10-CM | POA: Diagnosis not present

## 2016-12-09 DIAGNOSIS — Z87891 Personal history of nicotine dependence: Secondary | ICD-10-CM | POA: Insufficient documentation

## 2016-12-09 DIAGNOSIS — C771 Secondary and unspecified malignant neoplasm of intrathoracic lymph nodes: Secondary | ICD-10-CM | POA: Diagnosis not present

## 2016-12-09 DIAGNOSIS — R918 Other nonspecific abnormal finding of lung field: Secondary | ICD-10-CM

## 2016-12-09 DIAGNOSIS — C801 Malignant (primary) neoplasm, unspecified: Secondary | ICD-10-CM | POA: Diagnosis not present

## 2016-12-09 DIAGNOSIS — R222 Localized swelling, mass and lump, trunk: Secondary | ICD-10-CM | POA: Diagnosis not present

## 2016-12-09 DIAGNOSIS — M533 Sacrococcygeal disorders, not elsewhere classified: Secondary | ICD-10-CM | POA: Diagnosis not present

## 2016-12-09 HISTORY — PX: VIDEO BRONCHOSCOPY WITH ENDOBRONCHIAL ULTRASOUND: SHX6177

## 2016-12-09 SURGERY — BRONCHOSCOPY, WITH EBUS
Anesthesia: General

## 2016-12-09 MED ORDER — ACETAMINOPHEN 650 MG RE SUPP: 650.0000 mg | RECTAL | Status: DC | PRN

## 2016-12-09 MED ORDER — LIDOCAINE HCL (CARDIAC) 20 MG/ML IV SOLN
INTRAVENOUS | Status: AC
  Filled 2016-12-09: qty 5

## 2016-12-09 MED ORDER — SUGAMMADEX SODIUM 200 MG/2ML IV SOLN
INTRAVENOUS | Status: DC | PRN
  Administered 2016-12-09: 150 mg via INTRAVENOUS

## 2016-12-09 MED ORDER — ROCURONIUM BROMIDE 100 MG/10ML IV SOLN
INTRAVENOUS | Status: DC | PRN
  Administered 2016-12-09: 10 mg via INTRAVENOUS
  Administered 2016-12-09: 40 mg via INTRAVENOUS

## 2016-12-09 MED ORDER — ACETAMINOPHEN 325 MG PO TABS: 650.0000 mg | ORAL_TABLET | ORAL | Status: DC | PRN

## 2016-12-09 MED ORDER — MIDAZOLAM HCL 2 MG/2ML IJ SOLN
INTRAMUSCULAR | Status: AC
  Filled 2016-12-09: qty 2

## 2016-12-09 MED ORDER — PROPOFOL 10 MG/ML IV BOLUS
INTRAVENOUS | Status: AC
  Filled 2016-12-09: qty 20

## 2016-12-09 MED ORDER — SODIUM CHLORIDE 0.9% FLUSH: 3.0000 mL | Freq: Two times a day (BID) | INTRAVENOUS | Status: DC

## 2016-12-09 MED ORDER — ONDANSETRON HCL 4 MG/2ML IJ SOLN
INTRAMUSCULAR | Status: DC | PRN
  Administered 2016-12-09: 4 mg via INTRAVENOUS

## 2016-12-09 MED ORDER — FENTANYL CITRATE (PF) 250 MCG/5ML IJ SOLN
INTRAMUSCULAR | Status: AC
  Filled 2016-12-09: qty 5

## 2016-12-09 MED ORDER — FENTANYL CITRATE (PF) 100 MCG/2ML IJ SOLN
INTRAMUSCULAR | Status: DC | PRN
  Administered 2016-12-09 (×3): 50 ug via INTRAVENOUS

## 2016-12-09 MED ORDER — MIDAZOLAM HCL 5 MG/5ML IJ SOLN
INTRAMUSCULAR | Status: DC | PRN
  Administered 2016-12-09: 2 mg via INTRAVENOUS

## 2016-12-09 MED ORDER — SODIUM CHLORIDE 0.9% FLUSH: 3.0000 mL | INTRAVENOUS | Status: DC | PRN

## 2016-12-09 MED ORDER — 0.9 % SODIUM CHLORIDE (POUR BTL) OPTIME
TOPICAL | Status: DC | PRN
  Administered 2016-12-09: 1000 mL

## 2016-12-09 MED ORDER — EPINEPHRINE PF 1 MG/ML IJ SOLN
INTRAMUSCULAR | Status: DC | PRN
  Administered 2016-12-09: 1 mg

## 2016-12-09 MED ORDER — EPINEPHRINE PF 1 MG/ML IJ SOLN
INTRAMUSCULAR | Status: AC
  Filled 2016-12-09: qty 1

## 2016-12-09 MED ORDER — FENTANYL CITRATE (PF) 100 MCG/2ML IJ SOLN: 25.0000 ug | INTRAMUSCULAR | Status: DC | PRN

## 2016-12-09 MED ORDER — SODIUM CHLORIDE 0.9 % IV SOLN: 250.0000 mL | INTRAVENOUS | Status: DC | PRN

## 2016-12-09 MED ORDER — PROPOFOL 10 MG/ML IV BOLUS
INTRAVENOUS | Status: DC | PRN
  Administered 2016-12-09: 100 mg via INTRAVENOUS
  Administered 2016-12-09: 40 mg via INTRAVENOUS

## 2016-12-09 MED ORDER — LACTATED RINGERS IV SOLN
INTRAVENOUS | Status: DC | PRN
  Administered 2016-12-09: 13:00:00 via INTRAVENOUS

## 2016-12-09 MED ORDER — LACTATED RINGERS IV SOLN
INTRAVENOUS | Status: DC
  Administered 2016-12-09: 12:00:00 via INTRAVENOUS

## 2016-12-09 MED ORDER — ROCURONIUM BROMIDE 10 MG/ML (PF) SYRINGE
PREFILLED_SYRINGE | INTRAVENOUS | Status: AC
Start: 2016-12-09 — End: ?
  Filled 2016-12-09: qty 5

## 2016-12-09 MED ORDER — LIDOCAINE HCL (CARDIAC) 20 MG/ML IV SOLN
INTRAVENOUS | Status: DC | PRN
  Administered 2016-12-09: 80 mg via INTRAVENOUS

## 2016-12-09 SURGICAL SUPPLY — 34 items
BRUSH CYTOL CELLEBRITY 1.5X140 (MISCELLANEOUS) IMPLANT
BRUSH CYTOL CELLEBRITY 1.9X150 (MISCELLANEOUS) ×2 IMPLANT
CANISTER SUCT 3000ML PPV (MISCELLANEOUS) ×3 IMPLANT
CONT SPEC 4OZ CLIKSEAL STRL BL (MISCELLANEOUS) ×5 IMPLANT
COVER BACK TABLE 60X90IN (DRAPES) ×3 IMPLANT
COVER DOME SNAP 22 D (MISCELLANEOUS) ×3 IMPLANT
FILTER STRAW FLUID ASPIR (MISCELLANEOUS) ×3 IMPLANT
FORCEPS BIOP RJ4 1.8 (CUTTING FORCEPS) ×2 IMPLANT
GAUZE SPONGE 4X4 12PLY STRL (GAUZE/BANDAGES/DRESSINGS) IMPLANT
GLOVE SURG SIGNA 7.5 PF LTX (GLOVE) ×3 IMPLANT
GOWN STRL REUS W/ TWL XL LVL3 (GOWN DISPOSABLE) ×1 IMPLANT
GOWN STRL REUS W/TWL XL LVL3 (GOWN DISPOSABLE) ×3
KIT CLEAN ENDO COMPLIANCE (KITS) ×6 IMPLANT
KIT ROOM TURNOVER OR (KITS) ×3 IMPLANT
MARKER SKIN DUAL TIP RULER LAB (MISCELLANEOUS) ×3 IMPLANT
NDL BLUNT 18X1 FOR OR ONLY (NEEDLE) IMPLANT
NDL EBUS SONO TIP PENTAX (NEEDLE) ×1 IMPLANT
NEEDLE 22X1 1/2 (OR ONLY) (NEEDLE) IMPLANT
NEEDLE BLUNT 18X1 FOR OR ONLY (NEEDLE) IMPLANT
NEEDLE EBUS SONO TIP PENTAX (NEEDLE) ×3 IMPLANT
NS IRRIG 1000ML POUR BTL (IV SOLUTION) ×3 IMPLANT
OIL SILICONE PENTAX (PARTS (SERVICE/REPAIRS)) ×3 IMPLANT
PAD ARMBOARD 7.5X6 YLW CONV (MISCELLANEOUS) ×6 IMPLANT
SYR 20CC LL (SYRINGE) ×3 IMPLANT
SYR 20ML ECCENTRIC (SYRINGE) ×6 IMPLANT
SYR 3ML LL SCALE MARK (SYRINGE) IMPLANT
SYR 5ML LL (SYRINGE) ×3 IMPLANT
SYR 5ML LUER SLIP (SYRINGE) ×3 IMPLANT
SYR TB 1ML 26GX3/8 SAFETY (SYRINGE) ×2 IMPLANT
TOWEL GREEN STERILE FF (TOWEL DISPOSABLE) ×3 IMPLANT
TRAP SPECIMEN MUCOUS 40CC (MISCELLANEOUS) ×3 IMPLANT
TUBE CONNECTING 20'X1/4 (TUBING) ×1
TUBE CONNECTING 20X1/4 (TUBING) ×2 IMPLANT
WATER STERILE IRR 1000ML POUR (IV SOLUTION) ×3 IMPLANT

## 2016-12-09 NOTE — Discharge Instructions (Addendum)
Do not drive or engage in heavy physical activity for 24 hours  You may resume normal activities tomorrow  You may cough up small amounts of blood over the next few days.  You may use over the counter cough medication if needed.  You may use acetaminophen (Tylenol) if needed for discomfort.  Follow up with Dr. Julien Nordmann as scheduled  Call [phone] if you develop chest pain, shortness of breath, fever > 101 F or cough up more than 2 tablespoons of blood

## 2016-12-09 NOTE — Telephone Encounter (Signed)
Oncology Nurse Navigator Documentation  Oncology Nurse Navigator Flowsheets 12/09/2016  Navigator Location [facility]  Navigator Encounter Type Telephone/Ms. Riemann called and left vm message.  I called her back. She is on Mr. Colclough release of information form.  She had questions about next steps. I updated her. She was thankful for the clarification.   Telephone Outgoing Call;Incoming Call  Confirmed Diagnosis Date 12/09/2016  Patient Visit Type Inpatient  Treatment Phase Pre-Tx/Tx Discussion  Barriers/Navigation Needs Education  Education Other  Interventions Education  Education Method Verbal  Acuity Level 1  Time Spent with Patient 15

## 2016-12-09 NOTE — Interval H&P Note (Signed)
History and Physical Interval Note:  12/09/2016 1:05 PM  Harry Allen  has presented today for surgery, with the diagnosis of RIGHT LUNG MASS  The various methods of treatment have been discussed with the patient and family. After consideration of risks, benefits and other options for treatment, the patient has consented to  Procedure(s): [place] (N/A) as a surgical intervention .  The patient's history has been reviewed, patient examined, no change in status, stable for surgery.  I have reviewed the patient's chart and labs.  Questions were answered to the patient's satisfaction.     [name]

## 2016-12-09 NOTE — Brief Op Note (Signed)
12/09/2016  3:23 PM  PATIENT:  Harry Allen  76 y.o. male  PRE-OPERATIVE DIAGNOSIS:  RIGHT LUNG MASS  POST-OPERATIVE DIAGNOSIS:  RIGHT LUNG MASS- Non small cell carcinoma  PROCEDURE:  Procedure(s): VIDEO BRONCHOSCOPY WITH ENDOBRONCHIAL ULTRASOUND (N/A)  SURGEON:  Surgeon(s) and Role:    * [name], MD - Primary  ASSISTANTS: none   ANESTHESIA:   general  EBL:  Total I/O In: 1000 [I.V.:1000] Out: 5 [Blood:5]  BLOOD ADMINISTERED:none  DRAINS: none   LOCAL MEDICATIONS USED:  NONE  SPECIMEN:  Source of Specimen:  level 7 and 11R lymph nodes, Right lower lobe mass  DISPOSITION OF SPECIMEN:  PATHOLOGY  PLAN OF CARE: Discharge to home after PACU  PATIENT DISPOSITION:  PACU - hemodynamically stable.   Delay start of Pharmacological VTE agent (>24hrs) due to surgical blood loss or risk of bleeding: not applicable

## 2016-12-09 NOTE — H&P (View-Only) (Signed)
PCP is [name], MD Referring Provider is Curt Bears, MD  Chief Complaint  Patient presents with  . Lung Lesion    surgical eval for tissue BX, Chest CT 10/22/16 PET Scan and MR Brain scheduled for 12/05/16    HPI: Mr. Kuhrt is a 76 year old gentleman who presents with chief complaint of hemoptysis.  Mr. Thon is a 76 year old man with a 40+-pack-year history of tobacco abuse and self diagnosed COPD. He also has a history of sleep apnea but does not use CPAP because it is cumbersome. About a month and a half ago he started coughing up small amounts of blood. She did not seek medical attention initially but when his wife found out she insisted that he see his doctor. A CT of the chest was done on 10/22/2016. It showed a large mass in the superior segment of the right lower lobe possibly invading the hilum. There also was a smaller right upper lobe nodule. Hilar or mediastinal adenopathy was noted as well.  He has not had any change in appetite but has lost 3 pounds over the past 3 months. He gets short of breath with heavy exertion but rides a bike on a regular basis. He self medicates for heartburn with Prilosec. He takes that once every couple of weeks. He has not had any chest pain, pressure, or tightness. He denies headaches or visual changes.  Zubrod Score: At the time of surgery this patient's most appropriate activity status/level should be described as: [x]     0    Normal activity, no symptoms []     1    Restricted in physical strenuous activity but ambulatory, able to do out light work []     2    Ambulatory and capable of self care, unable to do work activities, up and about >50 % of waking hours                              []     3    Only limited self care, in bed greater than 50% of waking hours []     4    Completely disabled, no self care, confined to bed or chair []     5    Moribund  Past Medical History:  Diagnosis Date  . COPD (chronic obstructive pulmonary disease)  ([place])   . H/O hernia repair   . Sleep apnea     Past Surgical History:  Procedure Laterality Date  . APPENDECTOMY    . HERNIA REPAIR      Family History  Problem Relation Age of Onset  . Cancer Father     Social History Social History  Substance Use Topics  . Smoking status: Former Smoker    Packs/day: 1.00    Years: 8.00    Quit date: 11/19/2013  . Smokeless tobacco: Never Used  . Alcohol use Not on file    Current Outpatient Prescriptions  Medication Sig Dispense Refill  . omeprazole (PRILOSEC) 20 MG capsule Take by mouth.     No current facility-administered medications for this visit.     No Known Allergies  Review of Systems  Constitutional: Positive for unexpected weight change (Has lost 3 pounds in 3 months). Negative for activity change.  HENT: Negative for trouble swallowing and voice change.   Respiratory: Positive for apnea, cough (Hemoptysis) and shortness of breath (Heavy exertion).   Gastrointestinal: Positive for abdominal pain (Occasional reflux). Negative for abdominal  distention and blood in stool.  Genitourinary: Positive for frequency. Negative for dysuria.  Musculoskeletal: Negative for arthralgias, back pain and gait problem.  Neurological: Negative for dizziness, syncope and weakness.  Hematological: Negative for adenopathy. Does not bruise/bleed easily.  All other systems reviewed and are negative.   BP 126/76   Pulse 68   Resp 20   Ht 6' (1.829 m)   Wt 142 lb (64.4 kg)   SpO2 94% Comment: RA  BMI 19.26 kg/m  Physical Exam  Constitutional: He is oriented to person, place, and time. He appears well-developed and well-nourished. No distress.  HENT:  Head: Normocephalic and atraumatic.  Mouth/Throat: No oropharyngeal exudate.  Eyes: Conjunctivae and EOM are normal. No scleral icterus.  Neck: Neck supple. No thyromegaly present.  Cardiovascular: Normal rate, regular rhythm, normal heart sounds and intact distal pulses.  Exam reveals no  gallop and no friction rub.   No murmur heard. Pulmonary/Chest: Effort normal and breath sounds normal. No respiratory distress. He has no wheezes. He has no rales.  Abdominal: Soft. He exhibits no distension. There is no tenderness.  Musculoskeletal: He exhibits no edema.  Lymphadenopathy:    He has no cervical adenopathy.  Neurological: He is alert and oriented to person, place, and time. No cranial nerve deficit. He exhibits normal muscle tone.  Skin: Skin is warm and dry.  Vitals reviewed.    Diagnostic Tests: CT CHEST WITH CONTRAST  TECHNIQUE: Multidetector CT imaging of the chest was performed during intravenous contrast administration.  CONTRAST:  52mL ISOVUE-300 IOPAMIDOL (ISOVUE-300) INJECTION 61%  COMPARISON:  July 02, 2013  FINDINGS: Cardiovascular: There is no appreciable thoracic aortic aneurysm or dissection. There is atherosclerotic calcification, moderate, at the origin of the left subclavian artery. Milder atherosclerotic calcification is noted in the proximal left and right common carotid arteries. Visualized great vessels otherwise appear unremarkable. There are scattered foci of atherosclerotic calcification in the aorta as well as multiple foci of coronary artery calcification. Pericardium is not appreciably thickened. There is no major vessel pulmonary embolus.  Mediastinum/Nodes: Thyroid appears unremarkable. There is adenopathy anterior to the right main bronchus measuring 2.0 x 1.6 cm. There is a prominent lymph node immediately anterior to the carina measuring 1.6 x 1.1 cm. There may be hilar adenopathy indistinguishable from adjacent mass. There is a small hiatal hernia.  Lungs/Pleura: There is a mass in the superior segment of the right lower lobe which surrounds the superior segment right lower lobe bronchus and potentially may invade this bronchus. This mass, which measures 6.4 x 5.3 x 5.2 cm, extends into and invades the  right hilum. There may be adjacent hilar adenopathy.  There is underlying centrilobular emphysematous change. There is a nodular lesion in the apical segment of the right upper lobe measuring 1.2 x 0.8 cm, not present on prior study. There are scattered areas of scarring throughout the lungs bilaterally. Bullae are noted in the apices, more pronounced on the right than on the left. There is no pleural effusion or pleural thickening.  Upper Abdomen: No adrenal lesions are evident. There is a cyst arising from the periphery of the mid right kidney measuring 2.9 x 2.8 cm. There is aortic atherosclerosis in the upper abdominal region. Visualized upper abdominal structures otherwise appear unremarkable.  Musculoskeletal: There are no blastic or lytic bone lesions. 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  IMPRESSION: Mass arising in the superior segment right lower lobe extending into and likely invading the right hilum measuring 6.4 x 5.3 x 5.2 cm.  Smaller mass, a likely metastasis, in the apical segment of the right upper lobe measuring 1.2 x 0.8 cm.  Adenopathy anterior to the carina and anterior right main bronchus. There is probable hilar adenopathy on the right indistinguishable from the mass which is invading the right hilum.  Underlying emphysema. Areas of bullous disease in the apices. Scattered areas of scarring bilaterally.  Areas of atherosclerotic calcification including foci of coronary artery calcification.  Nuclear medicine PET study could be helpful for staging in this circumstance.  These results will be called to the ordering clinician or representative by the Radiologist Assistant, and communication documented in the PACS or zVision Dashboard.   Electronically Signed   By: Lowella Grip III M.D.   On: 10/22/2016 13:52 I personally reviewed the CT chest and concur with the findings noted above.  Impression: Mr. Brands is a 76 year old  gentleman with a history of tobacco abuse who presented with hemoptysis and was found to have a large mass (greater than 6 cm) in the superior segment of the right lower lobe. There also was a smaller right upper lobe nodule hilar adenopathy and moderate mediastinal adenopathy. 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  He needs biopsy for diagnosis and staging. He is to have a PET scan on the 18th (5 days from now). I will be out of town next week but will be able to do a biopsy the following week. I recommended that we proceed with bronchoscopy and endobronchial ultrasound for diagnosis and staging. He understands this is not therapeutic. We will do this in the operating room under general anesthesia on an outpatient basis. I discussed the general nature of the procedure with the patient and his wife. They understand there is no guarantee of a definitive diagnosis with this type of biopsy. I informed him of the indications, risks, benefits, and alternatives. They understand the risk include those associated with procedures done under general anesthesia up to and including MI, stroke, blood clots, or death. Procedure specific risks include bleeding, pneumothorax, and failure to establish a definitive diagnosis.  He understands and accepts the risk and agrees to proceed.  Plan: Bronchoscopy and endobronchial ultrasound on Monday, 12/09/2016  [name], MD [facility] [phone]

## 2016-12-09 NOTE — Anesthesia Preprocedure Evaluation (Addendum)
Anesthesia Evaluation  Patient identified by MRN, date of birth, ID band Patient awake    Reviewed: Allergy & Precautions, NPO status , Patient's Chart, lab work & pertinent test results  History of Anesthesia Complications Negative for: history of anesthetic complications  Airway Mallampati: II  TM Distance: >3 FB Neck ROM: Full    Dental  (+) Teeth Intact   Pulmonary sleep apnea , COPD, former smoker,    breath sounds clear to auscultation       Cardiovascular negative cardio ROS   Rhythm:Regular     Neuro/Psych negative neurological ROS  negative psych ROS   GI/Hepatic negative GI ROS, Neg liver ROS,   Endo/Other  negative endocrine ROS  Renal/GU negative Renal ROS     Musculoskeletal negative musculoskeletal ROS (+)   Abdominal   Peds  Hematology negative hematology ROS (+)   Anesthesia Other Findings   Reproductive/Obstetrics                            Anesthesia Physical Anesthesia Plan  ASA: II  Anesthesia Plan: General   Post-op Pain Management:    Induction: Intravenous  PONV Risk Score and Plan: 2 and Ondansetron and Dexamethasone  Airway Management Planned: Oral ETT  Additional Equipment: None  Intra-op Plan:   Post-operative Plan: Extubation in OR  Informed Consent: I have reviewed the patients History and Physical, chart, labs and discussed the procedure including the risks, benefits and alternatives for the proposed anesthesia with the patient or authorized representative who has indicated his/her understanding and acceptance.   Dental advisory given  Plan Discussed with: CRNA and Surgeon  Anesthesia Plan Comments:         Anesthesia Quick Evaluation

## 2016-12-09 NOTE — Anesthesia Procedure Notes (Addendum)
Procedure Name: Intubation Date/Time: 12/09/2016 1:45 PM Performed by: Julieta Bellini Pre-anesthesia Checklist: Patient identified, Emergency Drugs available and Suction available Patient Re-evaluated:Patient Re-evaluated prior to induction Oxygen Delivery Method: Circle system utilized Preoxygenation: Pre-oxygenation with 100% oxygen Induction Type: IV induction Ventilation: Mask ventilation without difficulty Laryngoscope Size: Mac and 4 Grade View: Grade I Tube type: Oral Tube size: 8.5 mm Number of attempts: 1 Airway Equipment and Method: Stylet Placement Confirmation: ETT inserted through vocal cords under direct vision,  positive ETCO2 and breath sounds checked- equal and bilateral Secured at: 23 cm Tube secured with: Tape Dental Injury: Teeth and Oropharynx as per pre-operative assessment  Comments: 8.5 ETT inserted by DL, cuff leak present. Exchanged for another 8.5 ETT over bougie. BBS, + ETCO2.   Prudence Davidson, SRNA

## 2016-12-09 NOTE — Transfer of Care (Signed)
Immediate Anesthesia Transfer of Care Note  Patient: Harry Allen  Procedure(s) Performed: Procedure(s): VIDEO BRONCHOSCOPY WITH ENDOBRONCHIAL ULTRASOUND (N/A)  Patient Location: PACU  Anesthesia Type:General  Level of Consciousness: awake, oriented, drowsy and patient cooperative  Airway & Oxygen Therapy: Patient Spontanous Breathing and Patient connected to nasal cannula oxygen  Post-op Assessment: Report given to RN and Post -op Vital signs reviewed and stable  Post vital signs: Reviewed and stable  Last Vitals:  Vitals:   12/09/16 1240 12/09/16 1503  BP:  132/73  Pulse: 66 (!) 101  Resp:  20  Temp:      Last Pain:  Vitals:   12/09/16 1217  TempSrc: Oral      Patients Stated Pain Goal: 3 (76/72/09 4709)  Complications: No apparent anesthesia complications

## 2016-12-10 ENCOUNTER — Encounter (HOSPITAL_COMMUNITY): Payer: Self-pay | Admitting: Thoracic Surgery (Cardiothoracic Vascular Surgery)

## 2016-12-10 NOTE — Op Note (Signed)
NAMEELSTON, [name]:  [id number]  MEDICAL RECORD NO.:  [id number]  LOCATION:                                 FACILITY:  PHYSICIAN:  Revonda Standard. Roxan Hockey, M.D. DATE OF BIRTH:  DATE OF PROCEDURE:  12/09/2016 DATE OF DISCHARGE:                              OPERATIVE REPORT   PREOPERATIVE DIAGNOSIS:  Right lung mass.  POSTOPERATIVE DIAGNOSIS:  Non-small cell carcinoma.  PROCEDURE:   Video bronchoscopy with brushings and endobronchial biopsies Endobronchial ultrasound with mediastinal lymph node aspirations.  SURGEON:  Revonda Standard. Roxan Hockey, M.D.  ASSISTANT:  None.  ANESTHESIA:  General.  FINDINGS:  Extrinsic compression of superior segmental bronchus with abnormal-appearing mucosa extending into the distal bronchus intermedius.  Brushings showed probable non-small cell carcinoma.  CLINICAL NOTE:  [name] is a 76 year old gentleman with a history of tobacco abuse, who presented about 6 weeks previously with hemoptysis. A CT of the chest showed a large mass in the superior segment of the right lower lobe with probable invasion of the hilum.  There was a small right upper lobe nodule and mediastinal and hilar adenopathy.  He was advised to undergo bronchoscopy and endobronchial ultrasound for diagnostic and staging purposes.  The indications, risks, benefits, and alternatives were discussed in detail, he understood and accepted the risks and agreed to proceed.  OPERATIVE NOTE:  [name] was brought to the operating room on December 09, 2016.  He had induction of general anesthesia and was intubated. Flexible fiberoptic bronchoscopy was performed via the endotracheal tube.  There was extrinsic compression and abnormal appearance of the mucosa at the origin of the right lower lobe superior segmental bronchus.  The remainder of the tracheobronchial tree was within normal limits with no endobronchial lesions to the level of subsegmental bronchi.  The  bronchoscope was removed.  The endobronchial ultrasound probe was advanced, systematic inspection of the mediastinal and right hilar lymph nodes was carried out.  A relatively enlarged subcarinal node was identified.  Multiple aspirations were performed.  With each aspiration, the needle was advanced into the lymph node with ultrasound guidance.  Duplex was used to confirm absence of any significant blood vessels in the area prior to aspirating.  Specimens were obtained both with and without suction applied.  Next, a level 11R node was identified and multiple samples were taken from this node as well.  There was no clear window to a node in the 4R location.  The endobronchial ultrasound probe was removed. The bronchoscope was reinserted and directed to the origin of the right lower lobe superior segmental bronchus.  Brushings were obtained and sent for Quick-Prep.  Multiple biopsies were taken.  There was some bleeding.  Dilute epinephrine was applied topically, which helped with the bleeding.  The results on the Quick-Prep of the mediastinal node showed no malignancy in the subcarinal node.  There were atypical cells in the 11R node. There was non-small cell carcinoma on the initial brushings.  The biopsies were all sent for permanent pathology.  A final inspection was made with the bronchoscope, and there was no significant ongoing bleeding.  The patient was extubated in the operating room  and taken to the postanesthetic care unit in good condition.     Revonda Standard Roxan Hockey, M.D.     SCH/MEDQ  D:  12/09/2016  T:  12/10/2016  Job:  [id number]

## 2016-12-10 NOTE — Anesthesia Postprocedure Evaluation (Signed)
Anesthesia Post Note  Patient: Harry Allen  Procedure(s) Performed: Procedure(s) (LRB): VIDEO BRONCHOSCOPY WITH ENDOBRONCHIAL ULTRASOUND (N/A)     Patient location during evaluation: PACU Anesthesia Type: General Level of consciousness: awake and alert Pain management: pain level controlled Vital Signs Assessment: post-procedure vital signs reviewed and stable Respiratory status: spontaneous breathing, nonlabored ventilation, respiratory function stable and patient connected to nasal cannula oxygen Cardiovascular status: stable Postop Assessment: no signs of nausea or vomiting Anesthetic complications: no    Last Vitals:  Vitals:   12/09/16 1533 12/09/16 1549  BP: 115/63 103/64  Pulse: 74 81  Resp: 12 14  Temp:  36.7 C    Last Pain:  Vitals:   12/09/16 1533  TempSrc:   PainSc: 0-No pain                 Marisa Hufstetler

## 2016-12-19 ENCOUNTER — Ambulatory Visit: Payer: Medicare Other

## 2016-12-19 ENCOUNTER — Encounter: Payer: Self-pay | Admitting: Internal Medicine

## 2016-12-19 ENCOUNTER — Other Ambulatory Visit: Payer: Medicare Other

## 2016-12-19 ENCOUNTER — Telehealth: Payer: Self-pay | Admitting: Internal Medicine

## 2016-12-19 ENCOUNTER — Ambulatory Visit (HOSPITAL_BASED_OUTPATIENT_CLINIC_OR_DEPARTMENT_OTHER): Payer: Medicare Other | Admitting: Internal Medicine

## 2016-12-19 DIAGNOSIS — C3431 Malignant neoplasm of lower lobe, right bronchus or lung: Secondary | ICD-10-CM | POA: Diagnosis not present

## 2016-12-19 DIAGNOSIS — C3491 Malignant neoplasm of unspecified part of right bronchus or lung: Secondary | ICD-10-CM

## 2016-12-19 DIAGNOSIS — Z7189 Other specified counseling: Secondary | ICD-10-CM

## 2016-12-19 DIAGNOSIS — Z5111 Encounter for antineoplastic chemotherapy: Secondary | ICD-10-CM

## 2016-12-19 HISTORY — DX: Malignant neoplasm of unspecified part of right bronchus or lung: C34.91

## 2016-12-19 MED ORDER — PROCHLORPERAZINE MALEATE 10 MG PO TABS: 10.0000 mg | ORAL_TABLET | Freq: Four times a day (QID) | ORAL | 0 refills | Status: DC | PRN

## 2016-12-19 NOTE — Telephone Encounter (Signed)
Called patient and left voicemail regarding his upcoming appointments in August.

## 2016-12-19 NOTE — Progress Notes (Signed)
START ON PATHWAY REGIMEN - Non-Small Cell Lung     Administer weekly:     Paclitaxel      Carboplatin   **Always confirm dose/schedule in your pharmacy ordering system**    Patient Characteristics: Stage III - Unresectable, PS = 0, 1 AJCC T Category: T4 Current Disease Status: No Distant Mets or Local Recurrence AJCC N Category: N1 AJCC M Category: M0 AJCC 8 Stage Grouping: IIIA Performance Status: PS = 0, 1 Intent of Therapy: Curative Intent, Discussed with Patient

## 2016-12-19 NOTE — Telephone Encounter (Signed)
Left voicemail for patient regarding his upcoming appointments in August.

## 2016-12-19 NOTE — Telephone Encounter (Signed)
Gave patient avs and calender for August.   A\Unable to schedule treatment due to cap day. Message sent to [facility].

## 2016-12-19 NOTE — Progress Notes (Signed)
[place] [phone]   [phone]  OFFICE PROGRESS NOTE  [name], MD [place] [address]  DIAGNOSIS: Stage IIIA (T4, N1, M0) non-small cell lung cancer, favoring adenocarcinoma presented with large right lower lobe lung mass in addition to right hilar lymphadenopathy and right upper lobe apical pulmonary nodule diagnosed in July 2018.  PRIOR THERAPY: None  CURRENT THERAPY: Concurrent chemoradiation with weekly carboplatin for AUC of 2 and paclitaxel 45 MG/M2. First dose 12/30/2013.  INTERVAL HISTORY: Harry Allen 76 y.o. male returns to the clinic today for follow-up visit accompanied by his wife. The patient is feeling fine today with no specific complaints except for persistent cough productive of blood-tinged sputum. He denied having any current chest pain, shortness of breath, cough or hemoptysis. He denied having any fever or chills. He has no nausea, vomiting, diarrhea or constipation. He has no significant weight loss or night sweats. The patient was seen recently by Dr. Roxan Hockey and he underwent a video bronchoscopy with endobronchial ultrasound on 12/09/2016 and the final pathology was consistent with non-small cell lung cancer, favoring adenocarcinoma. He also had a PET scan and MRI of the brain performed recently and he is here for evaluation and discussion of his treatment options.  MEDICAL HISTORY: Past Medical History:  Diagnosis Date  . Cancer ([place])    lung cancer  . COPD (chronic obstructive pulmonary disease) ([place])   . H/O hernia repair   . Sleep apnea     ALLERGIES:  is allergic to no known allergies.  MEDICATIONS:  Current Outpatient Prescriptions  Medication Sig Dispense Refill  . omeprazole (PRILOSEC) 20 MG capsule Take 20 mg by mouth daily as needed (acid reflux).      No current facility-administered medications for this visit.     SURGICAL HISTORY:  Past Surgical History:    Procedure Laterality Date  . APPENDECTOMY    . HERNIA REPAIR    . HYDROCELE EXCISION     on neck; 1968  . VIDEO BRONCHOSCOPY WITH ENDOBRONCHIAL ULTRASOUND N/A 12/09/2016   Procedure: VIDEO BRONCHOSCOPY WITH ENDOBRONCHIAL ULTRASOUND;  Surgeon: [name], MD;  Location: [facility];  Service: Thoracic;  Laterality: N/A;    REVIEW OF SYSTEMS:  Constitutional: negative Eyes: negative Ears, nose, mouth, throat, and face: negative Respiratory: positive for cough, hemoptysis and sputum Cardiovascular: negative Gastrointestinal: negative Genitourinary:negative Integument/breast: negative Hematologic/lymphatic: negative Musculoskeletal:negative Neurological: negative Behavioral/Psych: negative Endocrine: negative Allergic/Immunologic: negative   PHYSICAL EXAMINATION: General appearance: alert, cooperative, fatigued and no distress Head: Normocephalic, without obvious abnormality, atraumatic Neck: no adenopathy, no JVD, supple, symmetrical, trachea midline and thyroid not enlarged, symmetric, no tenderness/mass/nodules Lymph nodes: Cervical, supraclavicular, and axillary nodes normal. Resp: clear to auscultation bilaterally Back: symmetric, no curvature. ROM normal. No CVA tenderness. Cardio: regular rate and rhythm, S1, S2 normal, no murmur, click, rub or gallop GI: soft, non-tender; bowel sounds normal; no masses,  no organomegaly Extremities: extremities normal, atraumatic, no cyanosis or edema Neurologic: Alert and oriented X 3, normal strength and tone. Normal symmetric reflexes. Normal coordination and gait  ECOG PERFORMANCE STATUS: 1 - Symptomatic but completely ambulatory  Blood pressure 110/67, pulse 73, temperature 97.8 F (36.6 C), temperature source Oral, resp. rate 19, height 6' (1.829 m), weight 138 lb 1.6 oz (62.6 kg), SpO2 100 %.  LABORATORY DATA: Lab Results  Component Value Date   WBC 10.4 12/04/2016   HGB 13.1 12/04/2016   HCT 38.9 (L) 12/04/2016  MCV  88.0 12/04/2016   PLT 341 12/04/2016      Chemistry      Component Value Date/Time   NA 134 (L) 12/04/2016 1553   NA 141 11/19/2016 1344   K 4.1 12/04/2016 1553   K 4.8 11/19/2016 1344   CL 102 12/04/2016 1553   CO2 27 12/04/2016 1553   CO2 28 11/19/2016 1344   BUN 12 12/04/2016 1553   BUN 13.5 11/19/2016 1344   CREATININE 0.87 12/04/2016 1553   CREATININE 0.9 11/19/2016 1344      Component Value Date/Time   CALCIUM 9.2 12/04/2016 1553   CALCIUM 10.0 11/19/2016 1344   ALKPHOS 72 12/04/2016 1553   ALKPHOS 87 11/19/2016 1344   AST 17 12/04/2016 1553   AST 15 11/19/2016 1344   ALT 10 (L) 12/04/2016 1553   ALT 6 11/19/2016 1344   BILITOT 0.7 12/04/2016 1553   BILITOT 0.32 11/19/2016 1344       RADIOGRAPHIC STUDIES: Dg Chest 2 View  Result Date: 12/09/2016 CLINICAL DATA:  Right lung mass.  COPD. EXAM: CHEST  2 VIEW COMPARISON:  PET-CT 12/05/2016.  Chest CT 10/22/2016. FINDINGS: Mediastinum is stable. Heart size stable. Large right lung mass again noted. No pleural effusion or pneumothorax. COPD. Thoracic spine scoliosis. IMPRESSION: 1. Large right lung mass again noted. No acute abnormality identified. 2. COPD . Electronically Signed   By: Marcello Moores  Register   On: 12/09/2016 12:36   Mr Jeri Cos Wo Contrast  Result Date: 12/05/2016 CLINICAL DATA:  76 y/o  M; lung mass. EXAM: MRI HEAD WITHOUT AND WITH CONTRAST TECHNIQUE: Multiplanar, multiecho pulse sequences of the brain and surrounding structures were obtained without and with intravenous contrast. CONTRAST:  52mL MULTIHANCE GADOBENATE DIMEGLUMINE 529 MG/ML IV SOLN COMPARISON:  None. FINDINGS: Brain: No acute infarction, hemorrhage, hydrocephalus, extra-axial collection or mass lesion. Foci of nonspecific T2 FLAIR hyperintensity within subcortical periventricular white matter and the central pons are compatible mild chronic microvascular ischemic changes. Mild brain parenchymal volume loss. 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 No abnormal enhancement of the brain. Vascular: Normal flow voids. Skull and upper cervical spine: Normal marrow signal. Sinuses/Orbits: Moderate diffuse paranasal sinus mucosal thickening with multiple mucous retention cysts the largest in left maxillary sinus measuring up to 19 mm. Trace mastoid effusions. Unremarkable orbits. Other: Multiple nonenhancing adenoid cysts measuring up to 15 mm. IMPRESSION: 1. No intracranial metastatic disease identified. 2. Mild chronic microvascular ischemic changes and mild parenchymal volume loss of the brain. 3. Moderate diffuse paranasal sinus disease. Electronically Signed   By: Kristine Garbe M.D.   On: 12/05/2016 21:25   Nm Pet Image Initial (pi) Skull Base To Thigh  Result Date: 12/05/2016 CLINICAL DATA:  Initial treatment strategy for lung nodules. EXAM: NUCLEAR MEDICINE PET SKULL BASE TO THIGH TECHNIQUE: 7.6 mCi F-18 FDG was injected intravenously. Full-ring PET imaging was performed from the skull base to thigh after the radiotracer. CT data was obtained and used for attenuation correction and anatomic localization. Injection was right antecubital. FASTING BLOOD GLUCOSE:  Value: 89 mg/dl COMPARISON:  Chest CT dated 10/22/2016 FINDINGS: NECK No hypermetabolic lymph nodes in the neck. Atherosclerotic calcification of the carotid bulbs. Chronic left frontal and chronic bilateral ethmoid and maxillary sinusitis. CHEST Partially cavitary 5.9 by 4.8 cm right lower lobe infrahilar mass extending up towards the hilum, maximum SUV 15.1. By my measurement, this mass previously measured 5.2 by 4.8 cm on 10/22/2016. Right hilar lymph node maximum SUV 13.6. Apical segment right upper  lobe pulmonary nodule measures 1.2 by 0.8 cm on image 13/8 with maximum SUV 9.7. Severe primarily centrilobular emphysema. Mild postobstructive pneumonitis in the right lower lobe. Bilateral mild airway thickening. Mild chronic peripheral interstitial accentuation. Coronary,  aortic arch, and branch vessel atherosclerotic vascular disease. ABDOMEN/PELVIS No abnormal hypermetabolic activity within the liver, pancreas, adrenal glands, or spleen. No hypermetabolic lymph nodes in the abdomen or pelvis. Right kidney upper pole photopenic cyst. Aortoiliac atherosclerotic vascular disease. SKELETON No focal hypermetabolic activity to suggest skeletal metastasis. Focal hypermetabolic activity in the right antecubital region is believed to be injection related. I have confirmed that this was the site of injection. Levoconvex lumbar scoliosis.  The dextroconvex thoracic scoliosis. IMPRESSION: 1. Large hypermetabolic right lower lobe mass, maximum SUV 15.1. Hypermetabolic right hilar lymph node and a hypermetabolic apical segment right upper lobe pulmonary nodule indicative of ipsilateral hilar and pulmonary metastatic disease. No contralateral or extra thoracic metastatic disease identified. Assuming non-small cell lung cancer this represents T4 N1 M0 disease (stage IIIA). 2. Chronic paranasal sinusitis. 3. Aortic Atherosclerosis (ICD10-I70.0) and Emphysema (ICD10-J43.9). Coronary atherosclerosis. 4. S-shaped thoracolumbar scoliosis. Electronically Signed   By: Van Clines M.D.   On: 12/05/2016 16:50    ASSESSMENT AND PLAN: This is a very pleasant 76 years old African-American male recently diagnosed with a stage IIIA (T4, N1, M0) non-small cell lung cancer, favoring adenocarcinoma diagnosed in July 2018. I personally and independently reviewed the scan images and discuss the results with the patient today. There was insufficient material for molecular studies. I will arrange for the patient to have a blood test for molecular study with Guardant 360. I discussed with the patient his treatment options. I recommended for the patient a course of concurrent chemoradiation with weekly carboplatin for AUC of 2 and paclitaxel 45 MG/M2. This will be followed by consolidation treatment with  immunotherapy as the patient has no evidence for disease progression. I discussed with the patient adverse effect of the chemotherapy including but not limited to alopecia, myelosuppression, nausea and vomiting, peripheral neuropathy, liver or renal dysfunction. I will arrange for the patient to have a chemotherapy education class before starting the first dose of his chemotherapy. I will also refer the patient to radiation oncology for evaluation and discussion of the radiotherapy option. I will call his pharmacy with prescription for Compazine 10 mg by mouth every 6 hours as needed for nausea. He is expected to start the first dose of this treatment on 12/30/2016. I will see him back for follow-up visit in 3 weeks for evaluation and management of any adverse effect of his treatment. The patient was advised to call immediately if he has any concerning symptoms in the interval. The patient voices understanding of current disease status and treatment options and is in agreement with the current care plan. All questions were answered. The patient knows to call the clinic with any problems, questions or concerns. We can certainly see the patient much sooner if necessary. I spent 30 minutes counseling the patient face to face. The total time spent in the appointment was 40 minutes.   Disclaimer: This note was dictated with voice recognition software. Similar sounding words can inadvertently be transcribed and may not be corrected upon review.

## 2016-12-21 DIAGNOSIS — Z7189 Other specified counseling: Secondary | ICD-10-CM | POA: Insufficient documentation

## 2016-12-21 DIAGNOSIS — Z5111 Encounter for antineoplastic chemotherapy: Secondary | ICD-10-CM | POA: Insufficient documentation

## 2016-12-23 ENCOUNTER — Encounter: Payer: Self-pay | Admitting: *Deleted

## 2016-12-23 DIAGNOSIS — C3491 Malignant neoplasm of unspecified part of right bronchus or lung: Secondary | ICD-10-CM

## 2016-12-23 NOTE — Progress Notes (Signed)
Oncology Nurse Navigator Documentation  Oncology Nurse Navigator Flowsheets 12/23/2016  Navigator Location [facility]  Navigator Encounter Type Other/Dr. Julien Nordmann requested Guardant 360.  I completed order and request form.    Treatment Phase Pre-Tx/Tx Discussion  Barriers/Navigation Needs Coordination of Care  Interventions Coordination of Care  Coordination of Care Other  Acuity Level 2  Time Spent with Patient 45

## 2016-12-24 NOTE — Progress Notes (Signed)
Radiation Oncology         [phone]) [phone] ________________________________  Initial outpatient Consultation  Name: Harry Allen MRN: [id number]  Date: 12/25/2016  DOB: 03-19-1941  CC:[name], MD  Curt Bears, MD   REFERRING PHYSICIAN: Curt Bears, MD  DIAGNOSIS: 76 y.o. 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 Adenocarcinoma of right lung, stage 3 (HCC) C34.91     HISTORY OF PRESENT ILLNESS: Harry Allen is a 76 y.o. male seen at the request of Dr. Julien Nordmann. He initially presented to his PCP, Dr. Delfina Redwood, with complaints of chronic cough with hemoptysis since early May 2018. CT scan of the chest which was performed on 10/22/2016 and it showed a mass in the superior segment of the right lower lobe which surrounds the superior segment of the right lower lobe bronchus and potentially may invade this bronchus. This mass measured 6.4 x 5.3 x 5.2 cm and extends into and invades the right hilum. There may be adjacent hilar adenopathy. There was also a nodular lesion in the apical segment of the right upper lobe measuring 1.2 x 0.8 cm. This scan also showed adenopathy anterior to the right main bronchus measuring 2.0 x 1.6 cm. There was a prominent lymph node immediately anterior to the carina measuring 1.6 x 1.1 cm and then maybe hilar adenopathy. The patient has been traveling between [place] and [place] for a few weeks for some business. He was referred to Dr. Julien Nordmann in 11/2016 by Dr. Delfina Redwood for evaluation of his condition. In the meantime, he changed his primary care physician and he is currently seeing Dr. Melford Aase.  PET imaging and MRI brain were obtained on 12/05/16 for disease staging. PET scan confirmed a partially cavitary, 5.9 by 4.8 cm  large hypermetabolic right lower lobe mass, with SUV= 15.1. Hypermetabolic right hilar lymph node and a 1.2 by 0.8 cm hypermetabolic apical segment right upper lobe pulmonary nodule indicative  of ipsilateral hilar and pulmonary metastatic disease. No contralateral or extra thoracic metastatic disease identified. Assuming non-small cell lung cancer this represents T4 N1 M0 disease (stage IIIA).   MRI brain was negative for intracranial metastatic disease.  The patient was seen recently by Dr. Roxan Hockey and underwent a video bronchoscopy with EBUS on 12/09/2016. The final pathology was consistent with non-small cell lung cancer, favoring adenocarcinoma.  Guardant 360 blood work was recently sent off for molecular testing- results are still pending.  PREVIOUS RADIATION THERAPY: No  PAST MEDICAL HISTORY:  Past Medical History:  Diagnosis Date  . Adenocarcinoma of right lung, stage 3 ([place]) 12/19/2016  . Cancer ([place])    lung cancer  . COPD (chronic obstructive pulmonary disease) ([place])   . H/O hernia repair   . Sleep apnea       PAST SURGICAL HISTORY: Past Surgical History:  Procedure Laterality Date  . APPENDECTOMY    . HERNIA REPAIR    . HYDROCELE EXCISION     on neck; 1968  . VIDEO BRONCHOSCOPY WITH ENDOBRONCHIAL ULTRASOUND N/A 12/09/2016   Procedure: VIDEO BRONCHOSCOPY WITH ENDOBRONCHIAL ULTRASOUND;  Surgeon: [name], MD;  Location: [facility] OR;  Service: Thoracic;  Laterality: N/A;    FAMILY HISTORY:  Family History  Problem Relation Age of Onset  . Cancer Father     SOCIAL HISTORY:  Social History   Social History  . Marital status: Married    Spouse name: N/A  . Number of children: N/A  .  Years of education: N/A   Occupational History  . Not on file.   Social History Main Topics  . Smoking status: Former Smoker    Packs/day: 1.00    Years: 8.00    Quit date: 11/19/2013  . Smokeless tobacco: Never Used  . Alcohol use No  . Drug use: No  . Sexual activity: Not on file   Other Topics Concern  . Not on file   Social History Narrative  . No narrative on file    ALLERGIES: No known allergies  MEDICATIONS:  Current Outpatient Prescriptions   Medication Sig Dispense Refill  . omeprazole (PRILOSEC) 20 MG capsule Take 20 mg by mouth daily as needed (acid reflux).     . prochlorperazine (COMPAZINE) 10 MG tablet Take 1 tablet (10 mg total) by mouth every 6 (six) hours as needed for nausea or vomiting. 30 tablet 0   No current facility-administered medications for this encounter.     REVIEW OF SYSTEMS:  On review of systems, the patient reports that he is doing well overall. he denies any chest pain, shortness of breath, cough, fevers, chills, night sweats, unintended weight changes. He denies any bowel or bladder disturbances, and denies abdominal pain, nausea or vomiting. He denies any new musculoskeletal or joint aches or pains. A complete review of systems is obtained and is otherwise negative.    PHYSICAL EXAM:  Wt Readings from Last 3 Encounters:  12/19/16 138 lb 1.6 oz (62.6 kg)  12/09/16 139 lb (63 kg)  12/04/16 139 lb 6.4 oz (63.2 kg)   Temp Readings from Last 3 Encounters:  12/19/16 97.8 F (36.6 C) (Oral)  12/09/16 98 F (36.7 C)  12/04/16 (!) 97.4 F (36.3 C)   BP Readings from Last 3 Encounters:  12/19/16 110/67  12/09/16 103/64  12/04/16 102/71   Pulse Readings from Last 3 Encounters:  12/19/16 73  12/09/16 81  12/04/16 66    /10  In general this is a well appearing thin man in no acute distress. He is alert and oriented x4 and appropriate throughout the examination. HEENT reveals that the patient is normocephalic, atraumatic. EOMs are intact. PERRLA. Skin is intact without any evidence of gross lesions. Cardiovascular exam reveals a regular rate and rhythm, no clicks rubs or murmurs are auscultated. Chest is clear to auscultation bilaterally. Lymphatic assessment is performed and does not reveal any adenopathy in the cervical, supraclavicular, axillary, or inguinal chains. Abdomen has active bowel sounds in all quadrants and is intact. The abdomen is soft, non tender, non distended. Lower extremities are  negative for pretibial pitting edema, deep calf tenderness, cyanosis or clubbing.   KPS = 90  100 - Normal; no complaints; no evidence of disease. 90   - Able to carry on normal activity; minor signs or symptoms of disease. 80   - Normal activity with effort; some signs or symptoms of disease. 10   - Cares for self; unable to carry on normal activity or to do active work. 60   - Requires occasional assistance, but is able to care for most of his personal needs. 50   - Requires considerable assistance and frequent medical care. 70   - Disabled; requires special care and assistance. 10   - Severely disabled; hospital admission is indicated although death not imminent. 68   - Very sick; hospital admission necessary; active supportive treatment necessary. 10   - Moribund; fatal processes progressing rapidly. 0     - Dead  Karnofsky DA,  Abelmann [place], Craver LS and [place]al [facility] [phone]) The use of the nitrogen mustards in the palliative treatment of carcinoma: with particular reference to bronchogenic carcinoma Cancer 1 634-56  LABORATORY DATA:  Lab Results  Component Value Date   WBC 10.4 12/04/2016   HGB 13.1 12/04/2016   HCT 38.9 (L) 12/04/2016   MCV 88.0 12/04/2016   PLT 341 12/04/2016   Lab Results  Component Value Date   NA 134 (L) 12/04/2016   K 4.1 12/04/2016   CL 102 12/04/2016   CO2 27 12/04/2016   Lab Results  Component Value Date   ALT 10 (L) 12/04/2016   AST 17 12/04/2016   ALKPHOS 72 12/04/2016   BILITOT 0.7 12/04/2016     RADIOGRAPHY: Dg Chest 2 View  Result Date: 12/09/2016 CLINICAL DATA:  Right lung mass.  COPD. EXAM: CHEST  2 VIEW COMPARISON:  PET-CT 12/05/2016.  Chest CT 10/22/2016. FINDINGS: Mediastinum is stable. Heart size stable. Large right lung mass again noted. No pleural effusion or pneumothorax. COPD. Thoracic spine scoliosis. IMPRESSION: 1. Large right lung mass again noted. No acute abnormality identified. 2. COPD . Electronically Signed   By: Marcello Moores   Register   On: 12/09/2016 12:36   Mr Jeri Cos Wo Contrast  Result Date: 12/05/2016 CLINICAL DATA:  76 y/o  M; lung mass. EXAM: MRI HEAD WITHOUT AND WITH CONTRAST TECHNIQUE: Multiplanar, multiecho pulse sequences of the brain and surrounding structures were obtained without and with intravenous contrast. CONTRAST:  27mL MULTIHANCE GADOBENATE DIMEGLUMINE 529 MG/ML IV SOLN COMPARISON:  None. FINDINGS: Brain: No acute infarction, hemorrhage, hydrocephalus, extra-axial collection or mass lesion. Foci of nonspecific T2 FLAIR hyperintensity within subcortical periventricular white matter and the central pons are compatible mild chronic microvascular ischemic changes. Mild brain parenchymal volume loss. Small right posterior frontal developmental venous anomaly. No abnormal enhancement of the brain. Vascular: Normal flow voids. Skull and upper cervical spine: Normal marrow signal. Sinuses/Orbits: Moderate diffuse paranasal sinus mucosal thickening with multiple mucous retention cysts the largest in left maxillary sinus measuring up to 19 mm. Trace mastoid effusions. Unremarkable orbits. Other: Multiple nonenhancing adenoid cysts measuring up to 15 mm. IMPRESSION: 1. No intracranial metastatic disease identified. 2. Mild chronic microvascular ischemic changes and mild parenchymal volume loss of the brain. 3. Moderate diffuse paranasal sinus disease. Electronically Signed   By: Kristine Garbe M.D.   On: 12/05/2016 21:25   Nm Pet Image Initial (pi) Skull Base To Thigh  Result Date: 12/05/2016 CLINICAL DATA:  Initial treatment strategy for lung nodules. EXAM: NUCLEAR MEDICINE PET SKULL BASE TO THIGH TECHNIQUE: 7.6 mCi F-18 FDG was injected intravenously. Full-ring PET imaging was performed from the skull base to thigh after the radiotracer. CT data was obtained and used for attenuation correction and anatomic localization. Injection was right antecubital. FASTING BLOOD GLUCOSE:  Value: 89 mg/dl COMPARISON:   Chest CT dated 10/22/2016 FINDINGS: NECK No hypermetabolic lymph nodes in the neck. Atherosclerotic calcification of the carotid bulbs. Chronic left frontal and chronic bilateral ethmoid and maxillary sinusitis. CHEST Partially cavitary 5.9 by 4.8 cm right lower lobe infrahilar mass extending up towards the hilum, maximum SUV 15.1. By my measurement, this mass previously measured 5.2 by 4.8 cm on 10/22/2016. Right hilar lymph node maximum SUV 13.6. Apical segment right upper lobe pulmonary nodule measures 1.2 by 0.8 cm on image 13/8 with maximum SUV 9.7. Severe primarily centrilobular emphysema. Mild postobstructive pneumonitis in the right lower lobe. Bilateral mild airway thickening. Mild chronic peripheral interstitial accentuation. Coronary, aortic  arch, and branch vessel atherosclerotic vascular disease. ABDOMEN/PELVIS No abnormal hypermetabolic activity within the liver, pancreas, adrenal glands, or spleen. No hypermetabolic lymph nodes in the abdomen or pelvis. Right kidney upper pole photopenic cyst. Aortoiliac atherosclerotic vascular disease. SKELETON No focal hypermetabolic activity to suggest skeletal metastasis. Focal hypermetabolic activity in the right antecubital region is believed to be injection related. I have confirmed that this was the site of injection. Levoconvex lumbar scoliosis.  The dextroconvex thoracic scoliosis. IMPRESSION: 1. Large hypermetabolic right lower lobe mass, maximum SUV 15.1. Hypermetabolic right hilar lymph node and a hypermetabolic apical segment right upper lobe pulmonary nodule indicative of ipsilateral hilar and pulmonary metastatic disease. No contralateral or extra thoracic metastatic disease identified. Assuming non-small cell lung cancer this represents T4 N1 M0 disease (stage IIIA). 2. Chronic paranasal sinusitis. 3. Aortic Atherosclerosis (ICD10-I70.0) and Emphysema (ICD10-J43.9). Coronary atherosclerosis. 4. S-shaped thoracolumbar scoliosis. Electronically Signed    By: Van Clines M.D.   On: 12/05/2016 16:50      IMPRESSION/PLAN: 1. 76 y.o. 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  Today, we talked to the patient and family about the findings and workup thus far. We discussed the natural history of NSCLC and general treatment, highlighting the role of radiotherapy in the management. We discussed the available radiation techniques, and focused on the details of logistics and delivery. We anticipate a 6 week course of radiotherapy concurrent with chemotherapy.  We reviewed the anticipated acute and late sequelae associated with radiation in this setting. The patient was encouraged to ask questions that were answered to his satisfaction.  At the end of our discussion, the patient is interested in proceeding with concurrent chemoradiation.  Guardant 360 molecular testing is still pending.  The current recommendation is for concurrent chemoradiation with carboplatin and paclitaxel with consideration for consolidative immunotherapy pending molecular studies and response to treatment.  He anticipates beginning chemotherapy on 12/30/16. He is scheduled for CT Simulation on  12/26/16 at 8am or 9am with plans to begin radiotherapy on 12/30/16 as well.       Carola Rhine, PAC    Tyler Pita, MD  [facility] Direct Dial: [phone]  Fax: [phone] [email or web address]  Skype  [email or web address]

## 2016-12-25 ENCOUNTER — Ambulatory Visit
Admission: RE | Admit: 2016-12-25 | Discharge: 2016-12-25 | Disposition: A | Discharge status: Home / Self Care | Payer: Medicare Other | Source: Ambulatory Visit | Attending: Radiation Oncology | Admitting: Radiation Oncology

## 2016-12-25 VITALS — BP 113/80 | HR 71 | Temp 97.7°F | Resp 18 | Ht 74.0 in | Wt 140.2 lb

## 2016-12-25 DIAGNOSIS — G4730 Sleep apnea, unspecified: Secondary | ICD-10-CM | POA: Diagnosis not present

## 2016-12-25 DIAGNOSIS — Z87891 Personal history of nicotine dependence: Secondary | ICD-10-CM | POA: Diagnosis not present

## 2016-12-25 DIAGNOSIS — Z809 Family history of malignant neoplasm, unspecified: Secondary | ICD-10-CM | POA: Diagnosis not present

## 2016-12-25 DIAGNOSIS — Z8709 Personal history of other diseases of the respiratory system: Secondary | ICD-10-CM | POA: Diagnosis not present

## 2016-12-25 DIAGNOSIS — Z510 Encounter for antineoplastic radiation therapy: Secondary | ICD-10-CM | POA: Diagnosis not present

## 2016-12-25 DIAGNOSIS — C3491 Malignant neoplasm of unspecified part of right bronchus or lung: Secondary | ICD-10-CM

## 2016-12-25 DIAGNOSIS — J449 Chronic obstructive pulmonary disease, unspecified: Secondary | ICD-10-CM | POA: Diagnosis not present

## 2016-12-25 DIAGNOSIS — Z79899 Other long term (current) drug therapy: Secondary | ICD-10-CM | POA: Diagnosis not present

## 2016-12-25 DIAGNOSIS — C3431 Malignant neoplasm of lower lobe, right bronchus or lung: Secondary | ICD-10-CM | POA: Diagnosis not present

## 2016-12-26 ENCOUNTER — Encounter: Payer: Self-pay | Admitting: *Deleted

## 2016-12-26 ENCOUNTER — Ambulatory Visit
Admission: RE | Admit: 2016-12-26 | Discharge: 2016-12-26 | Disposition: A | Discharge status: Home / Self Care | Payer: Medicare Other | Source: Ambulatory Visit | Attending: Radiation Oncology | Admitting: Radiation Oncology

## 2016-12-26 ENCOUNTER — Other Ambulatory Visit: Payer: Medicare Other

## 2016-12-26 DIAGNOSIS — Z510 Encounter for antineoplastic radiation therapy: Secondary | ICD-10-CM | POA: Diagnosis not present

## 2016-12-26 DIAGNOSIS — C3491 Malignant neoplasm of unspecified part of right bronchus or lung: Secondary | ICD-10-CM

## 2016-12-26 DIAGNOSIS — C3411 Malignant neoplasm of upper lobe, right bronchus or lung: Secondary | ICD-10-CM | POA: Diagnosis not present

## 2016-12-26 DIAGNOSIS — C3431 Malignant neoplasm of lower lobe, right bronchus or lung: Secondary | ICD-10-CM | POA: Diagnosis not present

## 2016-12-26 DIAGNOSIS — Z79899 Other long term (current) drug therapy: Secondary | ICD-10-CM | POA: Diagnosis not present

## 2016-12-26 DIAGNOSIS — Z87891 Personal history of nicotine dependence: Secondary | ICD-10-CM | POA: Diagnosis not present

## 2016-12-26 DIAGNOSIS — G4730 Sleep apnea, unspecified: Secondary | ICD-10-CM | POA: Diagnosis not present

## 2016-12-26 DIAGNOSIS — J449 Chronic obstructive pulmonary disease, unspecified: Secondary | ICD-10-CM | POA: Diagnosis not present

## 2016-12-26 NOTE — Progress Notes (Signed)
  Radiation Oncology         [phone] ________________________________  Name: Harry Allen MRN: [id number]  Date: 12/26/2016  DOB: 06/18/1940  SIMULATION AND TREATMENT PLANNING NOTE    ICD-10-CM   1. Adenocarcinoma of right lung, stage 3 (HCC) C34.91     DIAGNOSIS:  76 y.o. 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  Identity was confirmed.  All relevant records and images related to the planned course of therapy were reviewed.  The patient freely provided informed written consent to proceed with treatment after reviewing the details related to the planned course of therapy. The consent form was witnessed and verified by the simulation staff.  Then, the patient was set-up in a stable reproducible  supine position for radiation therapy.  CT images were obtained.  Surface markings were placed.  The CT images were loaded into the planning software.  Then the target and avoidance structures were contoured.  Treatment planning then occurred.  The radiation prescription was entered and confirmed.  Then, I designed and supervised the construction of a total of 6 medically necessary complex treatment devices, including a BodyFix immobilization mold custom fitted to the patient along with 5 multileaf collimators conformally shaped radiation around the treatment target while shielding critical structures such as the heart and spinal cord maximally.  I have requested : 3D Simulation  I have requested a DVH of the following structures: Left lung, right lung, spinal cord, heart, esophagus, and target.  I have ordered:Nutrition Consult  SPECIAL TREATMENT PROCEDURE:  The planned course of therapy using radiation constitutes a special treatment procedure. Special care is required in the management of this patient for the following reasons.  The patient will be receiving concurrent chemotherapy requiring careful  monitoring for increased toxicities of treatment including periodic laboratory values.  The special nature of the planned course of radiotherapy will require increased physician supervision and oversight to ensure patient's safety with optimal treatment outcomes.  PLAN:  The patient will receive 66 Gy in 33 fractions.  ________________________________  Sheral Apley Tammi Klippel, M.D.  This document serves as a record of services personally performed by Tyler Pita, MD. It was created on his behalf by Arlyce Harman, a trained medical scribe. The creation of this record is based on the scribe's personal observations and the provider's statements to them. This document has been checked and approved by the attending provider.

## 2016-12-27 ENCOUNTER — Encounter: Payer: Self-pay | Admitting: Radiation Oncology

## 2016-12-27 DIAGNOSIS — Z79899 Other long term (current) drug therapy: Secondary | ICD-10-CM | POA: Diagnosis not present

## 2016-12-27 DIAGNOSIS — C3431 Malignant neoplasm of lower lobe, right bronchus or lung: Secondary | ICD-10-CM | POA: Diagnosis not present

## 2016-12-27 DIAGNOSIS — Z510 Encounter for antineoplastic radiation therapy: Secondary | ICD-10-CM | POA: Diagnosis not present

## 2016-12-27 DIAGNOSIS — Z87891 Personal history of nicotine dependence: Secondary | ICD-10-CM | POA: Diagnosis not present

## 2016-12-27 DIAGNOSIS — J449 Chronic obstructive pulmonary disease, unspecified: Secondary | ICD-10-CM | POA: Diagnosis not present

## 2016-12-27 DIAGNOSIS — G4730 Sleep apnea, unspecified: Secondary | ICD-10-CM | POA: Diagnosis not present

## 2016-12-27 DIAGNOSIS — C3411 Malignant neoplasm of upper lobe, right bronchus or lung: Secondary | ICD-10-CM | POA: Diagnosis not present

## 2016-12-27 NOTE — Addendum Note (Signed)
Encounter addended by: Heywood Footman, RN on: 12/27/2016 12:21 PM<BR>    Actions taken: Chief Complaint modified, Vitals modified, Visit Navigator Flowsheet section accepted, Home Medications modified, Medication taking status modified, Order Reconciliation Section accessed, Charge Capture section accepted

## 2016-12-30 ENCOUNTER — Other Ambulatory Visit: Payer: Self-pay | Admitting: Medical Oncology

## 2016-12-30 ENCOUNTER — Ambulatory Visit (HOSPITAL_BASED_OUTPATIENT_CLINIC_OR_DEPARTMENT_OTHER): Payer: Medicare Other

## 2016-12-30 ENCOUNTER — Other Ambulatory Visit (HOSPITAL_BASED_OUTPATIENT_CLINIC_OR_DEPARTMENT_OTHER): Payer: Medicare Other

## 2016-12-30 ENCOUNTER — Ambulatory Visit: Payer: Medicare Other | Admitting: Radiation Oncology

## 2016-12-30 VITALS — BP 117/70 | HR 61 | Temp 97.8°F | Resp 18

## 2016-12-30 DIAGNOSIS — C3491 Malignant neoplasm of unspecified part of right bronchus or lung: Secondary | ICD-10-CM

## 2016-12-30 DIAGNOSIS — C3431 Malignant neoplasm of lower lobe, right bronchus or lung: Secondary | ICD-10-CM

## 2016-12-30 DIAGNOSIS — Z5111 Encounter for antineoplastic chemotherapy: Secondary | ICD-10-CM

## 2016-12-30 LAB — CBC WITH DIFFERENTIAL/PLATELET
BASO%: 0.6 % (ref 0.0–2.0)
BASOS ABS: 0.1 10*3/uL (ref 0.0–0.1)
EOS%: 4.1 % (ref 0.0–7.0)
Eosinophils Absolute: 0.4 10*3/uL (ref 0.0–0.5)
HEMATOCRIT: 39.2 % (ref 38.4–49.9)
HGB: 13.2 g/dL (ref 13.0–17.1)
LYMPH#: 1.7 10*3/uL (ref 0.9–3.3)
LYMPH%: 15.7 % (ref 14.0–49.0)
MCH: 30 pg (ref 27.2–33.4)
MCHC: 33.5 g/dL (ref 32.0–36.0)
MCV: 89.3 fL (ref 79.3–98.0)
MONO#: 0.9 10*3/uL (ref 0.1–0.9)
MONO%: 8.2 % (ref 0.0–14.0)
NEUT#: 7.7 10*3/uL — ABNORMAL HIGH (ref 1.5–6.5)
NEUT%: 71.4 % (ref 39.0–75.0)
PLATELETS: 354 10*3/uL (ref 140–400)
RBC: 4.39 10*6/uL (ref 4.20–5.82)
RDW: 12.9 % (ref 11.0–14.6)
WBC: 10.8 10*3/uL — ABNORMAL HIGH (ref 4.0–10.3)

## 2016-12-30 LAB — COMPREHENSIVE METABOLIC PANEL
ALT: 8 U/L (ref 0–55)
ANION GAP: 7 meq/L (ref 3–11)
AST: 16 U/L (ref 5–34)
Albumin: 3.1 g/dL — ABNORMAL LOW (ref 3.5–5.0)
Alkaline Phosphatase: 88 U/L (ref 40–150)
BUN: 13.2 mg/dL (ref 7.0–26.0)
CALCIUM: 9.3 mg/dL (ref 8.4–10.4)
CO2: 25 meq/L (ref 22–29)
CREATININE: 0.8 mg/dL (ref 0.7–1.3)
Chloride: 104 mEq/L (ref 98–109)
Glucose: 86 mg/dl (ref 70–140)
POTASSIUM: 4.1 meq/L (ref 3.5–5.1)
Sodium: 136 mEq/L (ref 136–145)
Total Bilirubin: 0.41 mg/dL (ref 0.20–1.20)
Total Protein: 7.6 g/dL (ref 6.4–8.3)

## 2016-12-30 MED ORDER — PALONOSETRON HCL INJECTION 0.25 MG/5ML
INTRAVENOUS | Status: AC
  Filled 2016-12-30: qty 5

## 2016-12-30 MED ORDER — FAMOTIDINE IN NACL 20-0.9 MG/50ML-% IV SOLN
20.0000 mg | Freq: Once | INTRAVENOUS | Status: AC
  Administered 2016-12-30: 20 mg via INTRAVENOUS

## 2016-12-30 MED ORDER — SODIUM CHLORIDE 0.9 % IV SOLN
Freq: Once | INTRAVENOUS | Status: AC
  Administered 2016-12-30: 15:00:00 via INTRAVENOUS

## 2016-12-30 MED ORDER — DEXAMETHASONE SODIUM PHOSPHATE 100 MG/10ML IJ SOLN
20.0000 mg | Freq: Once | INTRAMUSCULAR | Status: AC
  Administered 2016-12-30: 20 mg via INTRAVENOUS
  Filled 2016-12-30: qty 2

## 2016-12-30 MED ORDER — PALONOSETRON HCL INJECTION 0.25 MG/5ML
0.2500 mg | Freq: Once | INTRAVENOUS | Status: AC
  Administered 2016-12-30: 0.25 mg via INTRAVENOUS

## 2016-12-30 MED ORDER — FAMOTIDINE IN NACL 20-0.9 MG/50ML-% IV SOLN
INTRAVENOUS | Status: AC
  Filled 2016-12-30: qty 50

## 2016-12-30 MED ORDER — DIPHENHYDRAMINE HCL 50 MG/ML IJ SOLN
INTRAMUSCULAR | Status: AC
Start: 2016-12-30 — End: ?
  Filled 2016-12-30: qty 1

## 2016-12-30 MED ORDER — DIPHENHYDRAMINE HCL 50 MG/ML IJ SOLN
50.0000 mg | Freq: Once | INTRAMUSCULAR | Status: AC
  Administered 2016-12-30: 50 mg via INTRAVENOUS

## 2016-12-30 MED ORDER — PACLITAXEL CHEMO INJECTION 300 MG/50ML
45.0000 mg/m2 | Freq: Once | INTRAVENOUS | Status: AC
  Administered 2016-12-30: 78 mg via INTRAVENOUS
  Filled 2016-12-30: qty 13

## 2016-12-30 MED ORDER — CARBOPLATIN CHEMO INJECTION 450 MG/45ML
161.2000 mg | Freq: Once | INTRAVENOUS | Status: AC
  Administered 2016-12-30: 160 mg via INTRAVENOUS
  Filled 2016-12-30: qty 16

## 2016-12-30 NOTE — Patient Instructions (Signed)
[facility] Discharge Instructions for Patients Receiving Chemotherapy  Today you received the following chemotherapy agents Taxol and Carboplatin.  To help prevent nausea and vomiting after your treatment, we encourage you to take your nausea medication as prescribed.   If you develop nausea and vomiting that is not controlled by your nausea medication, call the clinic.   BELOW ARE SYMPTOMS THAT SHOULD BE REPORTED IMMEDIATELY:  *FEVER GREATER THAN 100.5 F  *CHILLS WITH OR WITHOUT FEVER  NAUSEA AND VOMITING THAT IS NOT CONTROLLED WITH YOUR NAUSEA MEDICATION  *UNUSUAL SHORTNESS OF BREATH  *UNUSUAL BRUISING OR BLEEDING  TENDERNESS IN MOUTH AND THROAT WITH OR WITHOUT PRESENCE OF ULCERS  *URINARY PROBLEMS  *BOWEL PROBLEMS  UNUSUAL RASH Items with * indicate a potential emergency and should be followed up as soon as possible.  Feel free to call the clinic you have any questions or concerns. The clinic phone number is [phone].  Please show the CHEMO ALERT CARD at check-in to the Emergency Department and triage nurse.   

## 2016-12-31 ENCOUNTER — Ambulatory Visit
Admission: RE | Admit: 2016-12-31 | Discharge: 2016-12-31 | Disposition: A | Discharge status: Home / Self Care | Payer: Medicare Other | Source: Ambulatory Visit | Attending: Radiation Oncology | Admitting: Radiation Oncology

## 2016-12-31 DIAGNOSIS — J449 Chronic obstructive pulmonary disease, unspecified: Secondary | ICD-10-CM | POA: Diagnosis not present

## 2016-12-31 DIAGNOSIS — Z79899 Other long term (current) drug therapy: Secondary | ICD-10-CM | POA: Diagnosis not present

## 2016-12-31 DIAGNOSIS — G4730 Sleep apnea, unspecified: Secondary | ICD-10-CM | POA: Diagnosis not present

## 2016-12-31 DIAGNOSIS — Z510 Encounter for antineoplastic radiation therapy: Secondary | ICD-10-CM | POA: Diagnosis not present

## 2016-12-31 DIAGNOSIS — C3431 Malignant neoplasm of lower lobe, right bronchus or lung: Secondary | ICD-10-CM | POA: Diagnosis not present

## 2016-12-31 DIAGNOSIS — Z87891 Personal history of nicotine dependence: Secondary | ICD-10-CM | POA: Diagnosis not present

## 2016-12-31 DIAGNOSIS — C3411 Malignant neoplasm of upper lobe, right bronchus or lung: Secondary | ICD-10-CM | POA: Diagnosis not present

## 2017-01-01 ENCOUNTER — Ambulatory Visit
Admission: RE | Admit: 2017-01-01 | Discharge: 2017-01-01 | Disposition: A | Discharge status: Home / Self Care | Payer: Medicare Other | Source: Ambulatory Visit | Attending: Radiation Oncology | Admitting: Radiation Oncology

## 2017-01-01 DIAGNOSIS — G4730 Sleep apnea, unspecified: Secondary | ICD-10-CM | POA: Diagnosis not present

## 2017-01-01 DIAGNOSIS — Z87891 Personal history of nicotine dependence: Secondary | ICD-10-CM | POA: Diagnosis not present

## 2017-01-01 DIAGNOSIS — C3431 Malignant neoplasm of lower lobe, right bronchus or lung: Secondary | ICD-10-CM | POA: Diagnosis not present

## 2017-01-01 DIAGNOSIS — C3411 Malignant neoplasm of upper lobe, right bronchus or lung: Secondary | ICD-10-CM | POA: Diagnosis not present

## 2017-01-01 DIAGNOSIS — Z510 Encounter for antineoplastic radiation therapy: Secondary | ICD-10-CM | POA: Diagnosis not present

## 2017-01-01 DIAGNOSIS — J449 Chronic obstructive pulmonary disease, unspecified: Secondary | ICD-10-CM | POA: Diagnosis not present

## 2017-01-01 DIAGNOSIS — Z79899 Other long term (current) drug therapy: Secondary | ICD-10-CM | POA: Diagnosis not present

## 2017-01-02 ENCOUNTER — Ambulatory Visit: Payer: Medicare Other

## 2017-01-02 ENCOUNTER — Ambulatory Visit: Payer: Medicare Other | Admitting: Radiation Oncology

## 2017-01-02 ENCOUNTER — Ambulatory Visit: Payer: Self-pay

## 2017-01-02 ENCOUNTER — Ambulatory Visit
Admission: RE | Admit: 2017-01-02 | Discharge: 2017-01-02 | Disposition: A | Discharge status: Home / Self Care | Payer: Medicare Other | Source: Ambulatory Visit | Attending: Radiation Oncology | Admitting: Radiation Oncology

## 2017-01-02 DIAGNOSIS — C3411 Malignant neoplasm of upper lobe, right bronchus or lung: Secondary | ICD-10-CM | POA: Diagnosis not present

## 2017-01-02 DIAGNOSIS — J449 Chronic obstructive pulmonary disease, unspecified: Secondary | ICD-10-CM | POA: Diagnosis not present

## 2017-01-02 DIAGNOSIS — Z87891 Personal history of nicotine dependence: Secondary | ICD-10-CM | POA: Diagnosis not present

## 2017-01-02 DIAGNOSIS — Z79899 Other long term (current) drug therapy: Secondary | ICD-10-CM | POA: Diagnosis not present

## 2017-01-02 DIAGNOSIS — G4730 Sleep apnea, unspecified: Secondary | ICD-10-CM | POA: Diagnosis not present

## 2017-01-02 DIAGNOSIS — Z510 Encounter for antineoplastic radiation therapy: Secondary | ICD-10-CM | POA: Diagnosis not present

## 2017-01-02 DIAGNOSIS — C3431 Malignant neoplasm of lower lobe, right bronchus or lung: Secondary | ICD-10-CM | POA: Diagnosis not present

## 2017-01-03 ENCOUNTER — Ambulatory Visit: Payer: Medicare Other

## 2017-01-03 ENCOUNTER — Ambulatory Visit: Payer: Medicare Other | Admitting: Oncology

## 2017-01-03 ENCOUNTER — Other Ambulatory Visit: Payer: Medicare Other

## 2017-01-03 ENCOUNTER — Ambulatory Visit
Admission: RE | Admit: 2017-01-03 | Discharge: 2017-01-03 | Disposition: A | Discharge status: Home / Self Care | Payer: Medicare Other | Source: Ambulatory Visit | Attending: Radiation Oncology | Admitting: Radiation Oncology

## 2017-01-03 DIAGNOSIS — Z79899 Other long term (current) drug therapy: Secondary | ICD-10-CM | POA: Diagnosis not present

## 2017-01-03 DIAGNOSIS — C3411 Malignant neoplasm of upper lobe, right bronchus or lung: Secondary | ICD-10-CM | POA: Diagnosis not present

## 2017-01-03 DIAGNOSIS — G4730 Sleep apnea, unspecified: Secondary | ICD-10-CM | POA: Diagnosis not present

## 2017-01-03 DIAGNOSIS — C3431 Malignant neoplasm of lower lobe, right bronchus or lung: Secondary | ICD-10-CM | POA: Diagnosis not present

## 2017-01-03 DIAGNOSIS — Z87891 Personal history of nicotine dependence: Secondary | ICD-10-CM | POA: Diagnosis not present

## 2017-01-03 DIAGNOSIS — J449 Chronic obstructive pulmonary disease, unspecified: Secondary | ICD-10-CM | POA: Diagnosis not present

## 2017-01-03 DIAGNOSIS — Z510 Encounter for antineoplastic radiation therapy: Secondary | ICD-10-CM | POA: Diagnosis not present

## 2017-01-06 ENCOUNTER — Telehealth: Payer: Self-pay | Admitting: Oncology

## 2017-01-06 ENCOUNTER — Other Ambulatory Visit (HOSPITAL_BASED_OUTPATIENT_CLINIC_OR_DEPARTMENT_OTHER): Payer: Medicare Other

## 2017-01-06 ENCOUNTER — Ambulatory Visit
Admission: RE | Admit: 2017-01-06 | Discharge: 2017-01-06 | Disposition: A | Discharge status: Home / Self Care | Payer: Medicare Other | Source: Ambulatory Visit | Attending: Radiation Oncology | Admitting: Radiation Oncology

## 2017-01-06 ENCOUNTER — Ambulatory Visit (HOSPITAL_BASED_OUTPATIENT_CLINIC_OR_DEPARTMENT_OTHER): Payer: Medicare Other | Admitting: Oncology

## 2017-01-06 ENCOUNTER — Ambulatory Visit (HOSPITAL_BASED_OUTPATIENT_CLINIC_OR_DEPARTMENT_OTHER): Payer: Medicare Other

## 2017-01-06 ENCOUNTER — Encounter: Payer: Self-pay | Admitting: Oncology

## 2017-01-06 VITALS — BP 120/67 | HR 67 | Temp 94.8°F | Resp 17 | Ht 74.0 in | Wt 141.4 lb

## 2017-01-06 DIAGNOSIS — Z5111 Encounter for antineoplastic chemotherapy: Secondary | ICD-10-CM

## 2017-01-06 DIAGNOSIS — C3411 Malignant neoplasm of upper lobe, right bronchus or lung: Secondary | ICD-10-CM | POA: Diagnosis not present

## 2017-01-06 DIAGNOSIS — C3491 Malignant neoplasm of unspecified part of right bronchus or lung: Secondary | ICD-10-CM

## 2017-01-06 DIAGNOSIS — Z87891 Personal history of nicotine dependence: Secondary | ICD-10-CM | POA: Diagnosis not present

## 2017-01-06 DIAGNOSIS — C3431 Malignant neoplasm of lower lobe, right bronchus or lung: Secondary | ICD-10-CM

## 2017-01-06 DIAGNOSIS — G4730 Sleep apnea, unspecified: Secondary | ICD-10-CM | POA: Diagnosis not present

## 2017-01-06 DIAGNOSIS — J449 Chronic obstructive pulmonary disease, unspecified: Secondary | ICD-10-CM | POA: Diagnosis not present

## 2017-01-06 DIAGNOSIS — Z510 Encounter for antineoplastic radiation therapy: Secondary | ICD-10-CM | POA: Diagnosis not present

## 2017-01-06 DIAGNOSIS — Z79899 Other long term (current) drug therapy: Secondary | ICD-10-CM | POA: Diagnosis not present

## 2017-01-06 LAB — CBC WITH DIFFERENTIAL/PLATELET
BASO%: 0.5 % (ref 0.0–2.0)
BASOS ABS: 0 10*3/uL (ref 0.0–0.1)
EOS ABS: 0.2 10*3/uL (ref 0.0–0.5)
EOS%: 2.9 % (ref 0.0–7.0)
HCT: 40.6 % (ref 38.4–49.9)
HGB: 13.6 g/dL (ref 13.0–17.1)
LYMPH%: 13.3 % — AB (ref 14.0–49.0)
MCH: 30 pg (ref 27.2–33.4)
MCHC: 33.5 g/dL (ref 32.0–36.0)
MCV: 89.6 fL (ref 79.3–98.0)
MONO#: 0.4 10*3/uL (ref 0.1–0.9)
MONO%: 5.7 % (ref 0.0–14.0)
NEUT%: 77.6 % — ABNORMAL HIGH (ref 39.0–75.0)
NEUTROS ABS: 5 10*3/uL (ref 1.5–6.5)
NRBC: 0 % (ref 0–0)
PLATELETS: 310 10*3/uL (ref 140–400)
RBC: 4.53 10*6/uL (ref 4.20–5.82)
RDW: 12.7 % (ref 11.0–14.6)
WBC: 6.5 10*3/uL (ref 4.0–10.3)
lymph#: 0.9 10*3/uL (ref 0.9–3.3)

## 2017-01-06 LAB — COMPREHENSIVE METABOLIC PANEL
ALT: 9 U/L (ref 0–55)
ANION GAP: 7 meq/L (ref 3–11)
AST: 15 U/L (ref 5–34)
Albumin: 3.2 g/dL — ABNORMAL LOW (ref 3.5–5.0)
Alkaline Phosphatase: 86 U/L (ref 40–150)
BUN: 13.2 mg/dL (ref 7.0–26.0)
CHLORIDE: 102 meq/L (ref 98–109)
CO2: 26 mEq/L (ref 22–29)
Calcium: 9.7 mg/dL (ref 8.4–10.4)
Creatinine: 0.8 mg/dL (ref 0.7–1.3)
GLUCOSE: 98 mg/dL (ref 70–140)
Potassium: 4.2 mEq/L (ref 3.5–5.1)
SODIUM: 135 meq/L — AB (ref 136–145)
TOTAL PROTEIN: 7.9 g/dL (ref 6.4–8.3)
Total Bilirubin: 0.52 mg/dL (ref 0.20–1.20)

## 2017-01-06 MED ORDER — PALONOSETRON HCL INJECTION 0.25 MG/5ML
INTRAVENOUS | Status: AC
  Filled 2017-01-06: qty 5

## 2017-01-06 MED ORDER — FAMOTIDINE IN NACL 20-0.9 MG/50ML-% IV SOLN
20.0000 mg | Freq: Once | INTRAVENOUS | Status: AC
  Administered 2017-01-06: 20 mg via INTRAVENOUS

## 2017-01-06 MED ORDER — SODIUM CHLORIDE 0.9 % IV SOLN
20.0000 mg | Freq: Once | INTRAVENOUS | Status: AC
  Administered 2017-01-06: 20 mg via INTRAVENOUS
  Filled 2017-01-06: qty 2

## 2017-01-06 MED ORDER — FAMOTIDINE IN NACL 20-0.9 MG/50ML-% IV SOLN
INTRAVENOUS | Status: AC
  Filled 2017-01-06: qty 50

## 2017-01-06 MED ORDER — DIPHENHYDRAMINE HCL 50 MG/ML IJ SOLN
50.0000 mg | Freq: Once | INTRAMUSCULAR | Status: AC
  Administered 2017-01-06: 50 mg via INTRAVENOUS

## 2017-01-06 MED ORDER — SODIUM CHLORIDE 0.9 % IV SOLN
Freq: Once | INTRAVENOUS | Status: AC
  Administered 2017-01-06: 15:00:00 via INTRAVENOUS

## 2017-01-06 MED ORDER — PACLITAXEL CHEMO INJECTION 300 MG/50ML
45.0000 mg/m2 | Freq: Once | INTRAVENOUS | Status: AC
  Administered 2017-01-06: 78 mg via INTRAVENOUS
  Filled 2017-01-06: qty 13

## 2017-01-06 MED ORDER — SODIUM CHLORIDE 0.9 % IV SOLN
161.2000 mg | Freq: Once | INTRAVENOUS | Status: AC
  Administered 2017-01-06: 160 mg via INTRAVENOUS
  Filled 2017-01-06: qty 16

## 2017-01-06 MED ORDER — DIPHENHYDRAMINE HCL 50 MG/ML IJ SOLN
INTRAMUSCULAR | Status: AC
  Filled 2017-01-06: qty 1

## 2017-01-06 MED ORDER — PALONOSETRON HCL INJECTION 0.25 MG/5ML
0.2500 mg | Freq: Once | INTRAVENOUS | Status: AC
  Administered 2017-01-06: 0.25 mg via INTRAVENOUS

## 2017-01-06 MED ORDER — DEXAMETHASONE SODIUM PHOSPHATE 10 MG/ML IJ SOLN
INTRAMUSCULAR | Status: AC
  Filled 2017-01-06: qty 1

## 2017-01-06 NOTE — Progress Notes (Signed)
[facility] Follow up:    [name], MD [place] [address]   DIAGNOSIS: Cancer Staging No matching staging information was found for the patient. Stage IIIA (T4, N1, M0) non-small cell lung cancer, favoring adenocarcinoma presented with large right lower lobe lung mass in addition to right hilar lymphadenopathy and right upper lobe apical pulmonary nodule diagnosed in July 2018.  SUMMARY OF ONCOLOGIC HISTORY:  No history exists.    CURRENT THERAPY: Concurrent chemoradiation with weekly carboplatin for AUC of 2 and paclitaxel 45 MG/M2. First dose 12/30/2016.  INTERVAL HISTORY: Harry Allen 76 y.o. male returns for routine follow-up with his wife. The patient is feeling fine today with no specific complaints except for persistent cough productive of blood-tinged sputum. His cough is unchanged from baseline. The patient tolerated his first cycle chemotherapy well. He denied having any fevers or chills. He denies chest pain and shortness of breath. Denies abdominal pain, nausea, vomiting. Denies constipation and diarrhea. Denies neuropathy. The patient is here for evaluation prior to cycle 2 of his chemotherapy.   Patient Active Problem List   Diagnosis Date Noted  . Encounter for antineoplastic chemotherapy 12/21/2016  . Goals of care, counseling/discussion 12/21/2016  . Adenocarcinoma of right lung, stage 3 ([facility]) 12/19/2016  . Lung mass 11/11/2016  . SACROILIAC JOINT DYSFUNCTION 07/14/2007    is allergic to no known allergies.  MEDICAL HISTORY: Past Medical History:  Diagnosis Date  . Adenocarcinoma of right lung, stage 3 ([facility]) 12/19/2016  . Cancer ([facility])    lung cancer  . COPD (chronic obstructive pulmonary disease) ([facility])   . H/O hernia repair   . Sleep apnea     SURGICAL HISTORY: Past Surgical History:  Procedure Laterality Date  . APPENDECTOMY    . HERNIA REPAIR    . HYDROCELE EXCISION     on neck; 1968  . VIDEO  BRONCHOSCOPY WITH ENDOBRONCHIAL ULTRASOUND N/A 12/09/2016   Procedure: VIDEO BRONCHOSCOPY WITH ENDOBRONCHIAL ULTRASOUND;  Surgeon: [name], [place];  Location: [facility];  Service: Thoracic;  Laterality: N/A;    SOCIAL HISTORY: Social History   Social History  . Marital status: Married    Spouse name: N/A  . Number of children: N/A  . Years of education: N/A   Occupational History  . Not on file.   Social History Main Topics  . Smoking status: Former Smoker    Packs/day: 1.00    Years: 8.00    Quit date: 11/19/2013  . Smokeless tobacco: Never Used  . Alcohol use No  . Drug use: No  . Sexual activity: No   Other Topics Concern  . Not on file   Social History Narrative  . No narrative on file    FAMILY HISTORY: Family History  Problem Relation Age of Onset  . Cancer Father        pancreatic    Review of Systems  Constitutional: Negative.   HENT:  Negative.   Eyes: Negative.   Respiratory: Positive for cough. Negative for shortness of breath and wheezing.   Cardiovascular: Negative.   Gastrointestinal: Negative.   Endocrine: Negative.   Genitourinary: Negative.    Musculoskeletal: Negative.   Skin: Negative.   Neurological: Negative.   Hematological: Negative.   Psychiatric/Behavioral: Negative.       PHYSICAL EXAMINATION  ECOG PERFORMANCE STATUS: 1 - Symptomatic but completely ambulatory  Vitals:   01/06/17 1330  BP: 120/67  Pulse: 67  Resp: 17  Temp: (!)  94.8 F (34.9 C)  SpO2: 96%    Physical Exam  Constitutional: He is oriented to person, place, and time and well-developed, well-nourished, and in no distress. No distress.  HENT:  Head: Normocephalic.  Mouth/Throat: Oropharynx is clear and moist. No oropharyngeal exudate.  Eyes: Conjunctivae are normal. No scleral icterus.  Neck: Normal range of motion. Neck supple.  Cardiovascular: Normal rate, regular rhythm, normal heart sounds and intact distal pulses.   Pulmonary/Chest: Breath  sounds normal. No respiratory distress. He has no wheezes. He has no rales.  Abdominal: Soft. Bowel sounds are normal. He exhibits no distension and no mass.  Musculoskeletal: Normal range of motion. He exhibits no edema.  Lymphadenopathy:    He has no cervical adenopathy.  Neurological: He is alert and oriented to person, place, and time. He exhibits normal muscle tone. Gait normal.  Skin: Skin is warm and dry. No rash noted. He is not diaphoretic. No erythema. No pallor.  Psychiatric: Mood, memory, affect and judgment normal.  Vitals reviewed.   LABORATORY DATA:  CBC    Component Value Date/Time   WBC 6.5 01/06/2017 1249   WBC 10.4 12/04/2016 1553   RBC 4.53 01/06/2017 1249   RBC 4.42 12/04/2016 1553   HGB 13.6 01/06/2017 1249   HCT 40.6 01/06/2017 1249   PLT 310 01/06/2017 1249   MCV 89.6 01/06/2017 1249   MCH 30.0 01/06/2017 1249   MCH 29.6 12/04/2016 1553   MCHC 33.5 01/06/2017 1249   MCHC 33.7 12/04/2016 1553   RDW 12.7 01/06/2017 1249   LYMPHSABS 0.9 01/06/2017 1249   MONOABS 0.4 01/06/2017 1249   EOSABS 0.2 01/06/2017 1249   BASOSABS 0.0 01/06/2017 1249    CMP     Component Value Date/Time   NA 135 (L) 01/06/2017 1249   K 4.2 01/06/2017 1249   CL 102 12/04/2016 1553   CO2 26 01/06/2017 1249   GLUCOSE 98 01/06/2017 1249   BUN 13.2 01/06/2017 1249   CREATININE 0.8 01/06/2017 1249   CALCIUM 9.7 01/06/2017 1249   PROT 7.9 01/06/2017 1249   ALBUMIN 3.2 (L) 01/06/2017 1249   AST 15 01/06/2017 1249   ALT 9 01/06/2017 1249   ALKPHOS 86 01/06/2017 1249   BILITOT 0.52 01/06/2017 1249   GFRNONAA >60 12/04/2016 1553   GFRAA >60 12/04/2016 1553    RADIOGRAPHIC STUDIES:  No results found.  ASSESSMENT and THERAPY PLAN:   Adenocarcinoma of right lung, stage 3 ([facility]) This is a very pleasant 76 year old African-American male recently diagnosed with a stage IIIA (T4, N1, M0) non-small cell lung cancer, favoring adenocarcinoma diagnosed in July 2018. The patient is  currently undergoing a course of concurrent chemoradiation with weekly carboplatin for AUC of 2 and paclitaxel 45 MG/M2.   Patient was seen with Dr. Julien Nordmann. The patient is tolerating his chemotherapy well. Recommend that he proceed with cycle 2 today as scheduled.  He will follow-up next week for labs and chemotherapy and in 2 weeks for labs, visit, and chemotherapy.   No orders of the defined types were placed in this encounter.   All questions were answered. The patient knows to call the clinic with any problems, questions or concerns. We can certainly see the patient much sooner if necessary.  Mikey Bussing, NP 01/06/2017   ADDENDUM: Hematology/Oncology Attending: I had a face to face encounter with the patient today. I recommended his care plan. This is a very pleasant 76 years old African-American male recently diagnosed with a stage IIIa  non-small cell lung cancer favoring adenocarcinoma. He is currently undergoing a course of concurrent chemoradiation with weekly carboplatin and paclitaxel status post 1 week of treatment. He tolerated the first week of his treatment fairly well with no significant adverse effects. He denied having any fever or chills. He has no nausea, vomiting, diarrhea or constipation. I recommended for him to proceed with cycle #2 today as scheduled. I would see the patient back for follow-up visit in 2 weeks for reevaluation before starting cycle #4. He was advised to call immediately if he has any concerning symptoms in the interval.  Disclaimer: This note was dictated with voice recognition software. Similar sounding words can inadvertently be transcribed and may be missed upon review. Eilleen Kempf, MD 01/06/17

## 2017-01-06 NOTE — Telephone Encounter (Signed)
No additional appts scheduled per 8/20 los - no 8/20 los.

## 2017-01-06 NOTE — Patient Instructions (Signed)
[facility] Discharge Instructions for Patients Receiving Chemotherapy  Today you received the following chemotherapy agents Taxol and Carboplatin. To help prevent nausea and vomiting after your treatment, we encourage you to take your nausea medication as directed.  If you develop nausea and vomiting that is not controlled by your nausea medication, call the clinic.   BELOW ARE SYMPTOMS THAT SHOULD BE REPORTED IMMEDIATELY:  *FEVER GREATER THAN 100.5 F  *CHILLS WITH OR WITHOUT FEVER  NAUSEA AND VOMITING THAT IS NOT CONTROLLED WITH YOUR NAUSEA MEDICATION  *UNUSUAL SHORTNESS OF BREATH  *UNUSUAL BRUISING OR BLEEDING  TENDERNESS IN MOUTH AND THROAT WITH OR WITHOUT PRESENCE OF ULCERS  *URINARY PROBLEMS  *BOWEL PROBLEMS  UNUSUAL RASH Items with * indicate a potential emergency and should be followed up as soon as possible.  Feel free to call the clinic you have any questions or concerns. The clinic phone number is [phone].  Please show the CHEMO ALERT CARD at check-in to the Emergency Department and triage nurse.    

## 2017-01-06 NOTE — Assessment & Plan Note (Signed)
This is a very pleasant 76 year old African-American male recently diagnosed with a stage IIIA (T4, N1, M0) non-small cell lung cancer, favoring adenocarcinoma diagnosed in July 2018. The patient is currently undergoing a course of concurrent chemoradiation with weekly carboplatin for AUC of 2 and paclitaxel 45 MG/M2.   Patient was seen with Dr. Julien Nordmann. The patient is tolerating his chemotherapy well. Recommend that he proceed with cycle 2 today as scheduled.  He will follow-up next week for labs and chemotherapy and in 2 weeks for labs, visit, and chemotherapy.

## 2017-01-07 ENCOUNTER — Ambulatory Visit
Admission: RE | Admit: 2017-01-07 | Discharge: 2017-01-07 | Disposition: A | Discharge status: Home / Self Care | Payer: Medicare Other | Source: Ambulatory Visit | Attending: Radiation Oncology | Admitting: Radiation Oncology

## 2017-01-07 DIAGNOSIS — C3411 Malignant neoplasm of upper lobe, right bronchus or lung: Secondary | ICD-10-CM | POA: Diagnosis not present

## 2017-01-07 DIAGNOSIS — Z79899 Other long term (current) drug therapy: Secondary | ICD-10-CM | POA: Diagnosis not present

## 2017-01-07 DIAGNOSIS — Z510 Encounter for antineoplastic radiation therapy: Secondary | ICD-10-CM | POA: Diagnosis not present

## 2017-01-07 DIAGNOSIS — J449 Chronic obstructive pulmonary disease, unspecified: Secondary | ICD-10-CM | POA: Diagnosis not present

## 2017-01-07 DIAGNOSIS — C3431 Malignant neoplasm of lower lobe, right bronchus or lung: Secondary | ICD-10-CM | POA: Diagnosis not present

## 2017-01-07 DIAGNOSIS — G4730 Sleep apnea, unspecified: Secondary | ICD-10-CM | POA: Diagnosis not present

## 2017-01-07 DIAGNOSIS — Z87891 Personal history of nicotine dependence: Secondary | ICD-10-CM | POA: Diagnosis not present

## 2017-01-08 ENCOUNTER — Ambulatory Visit: Payer: Medicare Other

## 2017-01-09 ENCOUNTER — Ambulatory Visit
Admission: RE | Admit: 2017-01-09 | Discharge: 2017-01-09 | Disposition: A | Discharge status: Home / Self Care | Payer: Medicare Other | Source: Ambulatory Visit | Attending: Radiation Oncology | Admitting: Radiation Oncology

## 2017-01-09 ENCOUNTER — Ambulatory Visit: Payer: Medicare Other

## 2017-01-09 ENCOUNTER — Ambulatory Visit: Payer: Medicare Other | Admitting: Internal Medicine

## 2017-01-09 DIAGNOSIS — G4730 Sleep apnea, unspecified: Secondary | ICD-10-CM | POA: Diagnosis not present

## 2017-01-09 DIAGNOSIS — J449 Chronic obstructive pulmonary disease, unspecified: Secondary | ICD-10-CM | POA: Diagnosis not present

## 2017-01-09 DIAGNOSIS — Z79899 Other long term (current) drug therapy: Secondary | ICD-10-CM | POA: Diagnosis not present

## 2017-01-09 DIAGNOSIS — Z87891 Personal history of nicotine dependence: Secondary | ICD-10-CM | POA: Diagnosis not present

## 2017-01-09 DIAGNOSIS — C3431 Malignant neoplasm of lower lobe, right bronchus or lung: Secondary | ICD-10-CM | POA: Diagnosis not present

## 2017-01-09 DIAGNOSIS — C3411 Malignant neoplasm of upper lobe, right bronchus or lung: Secondary | ICD-10-CM | POA: Diagnosis not present

## 2017-01-09 DIAGNOSIS — Z510 Encounter for antineoplastic radiation therapy: Secondary | ICD-10-CM | POA: Diagnosis not present

## 2017-01-10 ENCOUNTER — Ambulatory Visit: Payer: Medicare Other

## 2017-01-10 ENCOUNTER — Ambulatory Visit
Admission: RE | Admit: 2017-01-10 | Discharge: 2017-01-10 | Disposition: A | Discharge status: Home / Self Care | Payer: Medicare Other | Source: Ambulatory Visit | Attending: Radiation Oncology | Admitting: Radiation Oncology

## 2017-01-10 ENCOUNTER — Other Ambulatory Visit: Payer: Medicare Other

## 2017-01-10 DIAGNOSIS — C3411 Malignant neoplasm of upper lobe, right bronchus or lung: Secondary | ICD-10-CM | POA: Diagnosis not present

## 2017-01-10 DIAGNOSIS — Z79899 Other long term (current) drug therapy: Secondary | ICD-10-CM | POA: Diagnosis not present

## 2017-01-10 DIAGNOSIS — Z87891 Personal history of nicotine dependence: Secondary | ICD-10-CM | POA: Diagnosis not present

## 2017-01-10 DIAGNOSIS — G4730 Sleep apnea, unspecified: Secondary | ICD-10-CM | POA: Diagnosis not present

## 2017-01-10 DIAGNOSIS — J449 Chronic obstructive pulmonary disease, unspecified: Secondary | ICD-10-CM | POA: Diagnosis not present

## 2017-01-10 DIAGNOSIS — Z510 Encounter for antineoplastic radiation therapy: Secondary | ICD-10-CM | POA: Diagnosis not present

## 2017-01-10 DIAGNOSIS — C3431 Malignant neoplasm of lower lobe, right bronchus or lung: Secondary | ICD-10-CM | POA: Diagnosis not present

## 2017-01-13 ENCOUNTER — Other Ambulatory Visit (HOSPITAL_BASED_OUTPATIENT_CLINIC_OR_DEPARTMENT_OTHER): Payer: Medicare Other

## 2017-01-13 ENCOUNTER — Other Ambulatory Visit: Payer: Self-pay | Admitting: Internal Medicine

## 2017-01-13 ENCOUNTER — Other Ambulatory Visit: Payer: Medicare Other

## 2017-01-13 ENCOUNTER — Ambulatory Visit: Payer: Medicare Other

## 2017-01-13 ENCOUNTER — Ambulatory Visit: Payer: Medicare Other | Admitting: Internal Medicine

## 2017-01-13 ENCOUNTER — Ambulatory Visit (HOSPITAL_BASED_OUTPATIENT_CLINIC_OR_DEPARTMENT_OTHER): Payer: Medicare Other

## 2017-01-13 ENCOUNTER — Ambulatory Visit
Admission: RE | Admit: 2017-01-13 | Discharge: 2017-01-13 | Disposition: A | Discharge status: Home / Self Care | Payer: Medicare Other | Source: Ambulatory Visit | Attending: Radiation Oncology | Admitting: Radiation Oncology

## 2017-01-13 VITALS — BP 107/59 | HR 77 | Temp 97.8°F | Resp 17

## 2017-01-13 DIAGNOSIS — C3491 Malignant neoplasm of unspecified part of right bronchus or lung: Secondary | ICD-10-CM

## 2017-01-13 DIAGNOSIS — Z5111 Encounter for antineoplastic chemotherapy: Secondary | ICD-10-CM | POA: Diagnosis present

## 2017-01-13 DIAGNOSIS — Z79899 Other long term (current) drug therapy: Secondary | ICD-10-CM | POA: Diagnosis not present

## 2017-01-13 DIAGNOSIS — C3431 Malignant neoplasm of lower lobe, right bronchus or lung: Secondary | ICD-10-CM | POA: Diagnosis present

## 2017-01-13 DIAGNOSIS — Z510 Encounter for antineoplastic radiation therapy: Secondary | ICD-10-CM | POA: Diagnosis not present

## 2017-01-13 DIAGNOSIS — Z87891 Personal history of nicotine dependence: Secondary | ICD-10-CM | POA: Diagnosis not present

## 2017-01-13 DIAGNOSIS — J449 Chronic obstructive pulmonary disease, unspecified: Secondary | ICD-10-CM | POA: Diagnosis not present

## 2017-01-13 DIAGNOSIS — C3411 Malignant neoplasm of upper lobe, right bronchus or lung: Secondary | ICD-10-CM | POA: Diagnosis not present

## 2017-01-13 DIAGNOSIS — G4730 Sleep apnea, unspecified: Secondary | ICD-10-CM | POA: Diagnosis not present

## 2017-01-13 LAB — CBC WITH DIFFERENTIAL/PLATELET
BASO%: 0.4 % (ref 0.0–2.0)
Basophils Absolute: 0 10*3/uL (ref 0.0–0.1)
EOS%: 3 % (ref 0.0–7.0)
Eosinophils Absolute: 0.1 10*3/uL (ref 0.0–0.5)
HEMATOCRIT: 37.5 % — AB (ref 38.4–49.9)
HGB: 12.5 g/dL — ABNORMAL LOW (ref 13.0–17.1)
LYMPH%: 14.6 % (ref 14.0–49.0)
MCH: 29.8 pg (ref 27.2–33.4)
MCHC: 33.3 g/dL (ref 32.0–36.0)
MCV: 89.3 fL (ref 79.3–98.0)
MONO#: 0.5 10*3/uL (ref 0.1–0.9)
MONO%: 10.4 % (ref 0.0–14.0)
NEUT#: 3.4 10*3/uL (ref 1.5–6.5)
NEUT%: 71.6 % (ref 39.0–75.0)
Platelets: 270 10*3/uL (ref 140–400)
RBC: 4.2 10*6/uL (ref 4.20–5.82)
RDW: 12.9 % (ref 11.0–14.6)
WBC: 4.7 10*3/uL (ref 4.0–10.3)
lymph#: 0.7 10*3/uL — ABNORMAL LOW (ref 0.9–3.3)
nRBC: 0 % (ref 0–0)

## 2017-01-13 LAB — COMPREHENSIVE METABOLIC PANEL
ALT: 6 U/L (ref 0–55)
ANION GAP: 7 meq/L (ref 3–11)
AST: 14 U/L (ref 5–34)
Albumin: 3 g/dL — ABNORMAL LOW (ref 3.5–5.0)
Alkaline Phosphatase: 82 U/L (ref 40–150)
BILIRUBIN TOTAL: 0.59 mg/dL (ref 0.20–1.20)
BUN: 12.2 mg/dL (ref 7.0–26.0)
CHLORIDE: 103 meq/L (ref 98–109)
CO2: 24 meq/L (ref 22–29)
CREATININE: 0.7 mg/dL (ref 0.7–1.3)
Calcium: 9.5 mg/dL (ref 8.4–10.4)
EGFR: >90 mL/min/{1.73_m2} (ref >90)
GLUCOSE: 86 mg/dL (ref 70–140)
Potassium: 3.8 mEq/L (ref 3.5–5.1)
Sodium: 134 mEq/L — ABNORMAL LOW (ref 136–145)
TOTAL PROTEIN: 7.4 g/dL (ref 6.4–8.3)

## 2017-01-13 MED ORDER — FAMOTIDINE IN NACL 20-0.9 MG/50ML-% IV SOLN
INTRAVENOUS | Status: AC
  Filled 2017-01-13: qty 50

## 2017-01-13 MED ORDER — DEXTROSE 5 % IV SOLN
45.0000 mg/m2 | Freq: Once | INTRAVENOUS | Status: AC
  Administered 2017-01-13: 78 mg via INTRAVENOUS
  Filled 2017-01-13: qty 13

## 2017-01-13 MED ORDER — DIPHENHYDRAMINE HCL 50 MG/ML IJ SOLN
50.0000 mg | Freq: Once | INTRAMUSCULAR | Status: AC
  Administered 2017-01-13: 50 mg via INTRAVENOUS

## 2017-01-13 MED ORDER — DIPHENHYDRAMINE HCL 50 MG/ML IJ SOLN
INTRAMUSCULAR | Status: AC
  Filled 2017-01-13: qty 1

## 2017-01-13 MED ORDER — FAMOTIDINE IN NACL 20-0.9 MG/50ML-% IV SOLN
20.0000 mg | Freq: Once | INTRAVENOUS | Status: AC
  Administered 2017-01-13: 20 mg via INTRAVENOUS

## 2017-01-13 MED ORDER — PALONOSETRON HCL INJECTION 0.25 MG/5ML
0.2500 mg | Freq: Once | INTRAVENOUS | Status: AC
  Administered 2017-01-13: 0.25 mg via INTRAVENOUS

## 2017-01-13 MED ORDER — DEXAMETHASONE SODIUM PHOSPHATE 100 MG/10ML IJ SOLN
20.0000 mg | Freq: Once | INTRAMUSCULAR | Status: AC
  Administered 2017-01-13: 20 mg via INTRAVENOUS
  Filled 2017-01-13: qty 2

## 2017-01-13 MED ORDER — SODIUM CHLORIDE 0.9 % IV SOLN
Freq: Once | INTRAVENOUS | Status: AC
  Administered 2017-01-13: 15:00:00 via INTRAVENOUS

## 2017-01-13 MED ORDER — SODIUM CHLORIDE 0.9 % IV SOLN
160.0000 mg | Freq: Once | INTRAVENOUS | Status: AC
  Administered 2017-01-13: 160 mg via INTRAVENOUS
  Filled 2017-01-13: qty 16

## 2017-01-13 MED ORDER — DEXAMETHASONE SODIUM PHOSPHATE 10 MG/ML IJ SOLN
INTRAMUSCULAR | Status: AC
  Filled 2017-01-13: qty 1

## 2017-01-13 MED ORDER — PALONOSETRON HCL INJECTION 0.25 MG/5ML
INTRAVENOUS | Status: AC
  Filled 2017-01-13: qty 5

## 2017-01-13 NOTE — Patient Instructions (Signed)
[facility] Discharge Instructions for Patients Receiving Chemotherapy  Today you received the following chemotherapy agents Carboplatin, Taxol.   To help prevent nausea and vomiting after your treatment, we encourage you to take your nausea medication as prescribed.    If you develop nausea and vomiting that is not controlled by your nausea medication, call the clinic.   BELOW ARE SYMPTOMS THAT SHOULD BE REPORTED IMMEDIATELY:  *FEVER GREATER THAN 100.5 F  *CHILLS WITH OR WITHOUT FEVER  NAUSEA AND VOMITING THAT IS NOT CONTROLLED WITH YOUR NAUSEA MEDICATION  *UNUSUAL SHORTNESS OF BREATH  *UNUSUAL BRUISING OR BLEEDING  TENDERNESS IN MOUTH AND THROAT WITH OR WITHOUT PRESENCE OF ULCERS  *URINARY PROBLEMS  *BOWEL PROBLEMS  UNUSUAL RASH Items with * indicate a potential emergency and should be followed up as soon as possible.  Feel free to call the clinic you have any questions or concerns. The clinic phone number is [phone].  Please show the [facility] at check-in to the Emergency Department and triage nurse.

## 2017-01-14 ENCOUNTER — Ambulatory Visit: Payer: Medicare Other

## 2017-01-14 ENCOUNTER — Ambulatory Visit
Admission: RE | Admit: 2017-01-14 | Discharge: 2017-01-14 | Disposition: A | Discharge status: Home / Self Care | Payer: Medicare Other | Source: Ambulatory Visit | Attending: Radiation Oncology | Admitting: Radiation Oncology

## 2017-01-14 DIAGNOSIS — C3411 Malignant neoplasm of upper lobe, right bronchus or lung: Secondary | ICD-10-CM | POA: Diagnosis not present

## 2017-01-14 DIAGNOSIS — G4730 Sleep apnea, unspecified: Secondary | ICD-10-CM | POA: Diagnosis not present

## 2017-01-14 DIAGNOSIS — Z87891 Personal history of nicotine dependence: Secondary | ICD-10-CM | POA: Diagnosis not present

## 2017-01-14 DIAGNOSIS — J449 Chronic obstructive pulmonary disease, unspecified: Secondary | ICD-10-CM | POA: Diagnosis not present

## 2017-01-14 DIAGNOSIS — Z79899 Other long term (current) drug therapy: Secondary | ICD-10-CM | POA: Diagnosis not present

## 2017-01-14 DIAGNOSIS — Z510 Encounter for antineoplastic radiation therapy: Secondary | ICD-10-CM | POA: Diagnosis not present

## 2017-01-14 DIAGNOSIS — C3431 Malignant neoplasm of lower lobe, right bronchus or lung: Secondary | ICD-10-CM | POA: Diagnosis not present

## 2017-01-15 ENCOUNTER — Ambulatory Visit: Payer: Medicare Other

## 2017-01-15 ENCOUNTER — Ambulatory Visit
Admission: RE | Admit: 2017-01-15 | Discharge: 2017-01-15 | Disposition: A | Discharge status: Home / Self Care | Payer: Medicare Other | Source: Ambulatory Visit | Attending: Radiation Oncology | Admitting: Radiation Oncology

## 2017-01-15 DIAGNOSIS — Z79899 Other long term (current) drug therapy: Secondary | ICD-10-CM | POA: Diagnosis not present

## 2017-01-15 DIAGNOSIS — Z510 Encounter for antineoplastic radiation therapy: Secondary | ICD-10-CM | POA: Diagnosis not present

## 2017-01-15 DIAGNOSIS — J449 Chronic obstructive pulmonary disease, unspecified: Secondary | ICD-10-CM | POA: Diagnosis not present

## 2017-01-15 DIAGNOSIS — C3411 Malignant neoplasm of upper lobe, right bronchus or lung: Secondary | ICD-10-CM | POA: Diagnosis not present

## 2017-01-15 DIAGNOSIS — C3431 Malignant neoplasm of lower lobe, right bronchus or lung: Secondary | ICD-10-CM | POA: Diagnosis not present

## 2017-01-15 DIAGNOSIS — G4730 Sleep apnea, unspecified: Secondary | ICD-10-CM | POA: Diagnosis not present

## 2017-01-15 DIAGNOSIS — Z87891 Personal history of nicotine dependence: Secondary | ICD-10-CM | POA: Diagnosis not present

## 2017-01-16 ENCOUNTER — Ambulatory Visit: Payer: Medicare Other

## 2017-01-16 ENCOUNTER — Ambulatory Visit
Admission: RE | Admit: 2017-01-16 | Discharge: 2017-01-16 | Disposition: A | Discharge status: Home / Self Care | Payer: Medicare Other | Source: Ambulatory Visit | Attending: Radiation Oncology | Admitting: Radiation Oncology

## 2017-01-16 DIAGNOSIS — Z510 Encounter for antineoplastic radiation therapy: Secondary | ICD-10-CM | POA: Diagnosis not present

## 2017-01-16 DIAGNOSIS — J449 Chronic obstructive pulmonary disease, unspecified: Secondary | ICD-10-CM | POA: Diagnosis not present

## 2017-01-16 DIAGNOSIS — G4730 Sleep apnea, unspecified: Secondary | ICD-10-CM | POA: Diagnosis not present

## 2017-01-16 DIAGNOSIS — Z79899 Other long term (current) drug therapy: Secondary | ICD-10-CM | POA: Diagnosis not present

## 2017-01-16 DIAGNOSIS — C3411 Malignant neoplasm of upper lobe, right bronchus or lung: Secondary | ICD-10-CM | POA: Diagnosis not present

## 2017-01-16 DIAGNOSIS — C3431 Malignant neoplasm of lower lobe, right bronchus or lung: Secondary | ICD-10-CM | POA: Diagnosis not present

## 2017-01-16 DIAGNOSIS — Z87891 Personal history of nicotine dependence: Secondary | ICD-10-CM | POA: Diagnosis not present

## 2017-01-17 ENCOUNTER — Ambulatory Visit (INDEPENDENT_AMBULATORY_CARE_PROVIDER_SITE_OTHER): Payer: Medicare Other | Admitting: Emergency Medicine

## 2017-01-17 ENCOUNTER — Encounter: Payer: Self-pay | Admitting: Emergency Medicine

## 2017-01-17 ENCOUNTER — Ambulatory Visit
Admission: RE | Admit: 2017-01-17 | Discharge: 2017-01-17 | Disposition: A | Discharge status: Home / Self Care | Payer: Medicare Other | Source: Ambulatory Visit | Attending: Radiation Oncology | Admitting: Radiation Oncology

## 2017-01-17 ENCOUNTER — Ambulatory Visit: Payer: Medicare Other

## 2017-01-17 VITALS — BP 92/54 | HR 76 | Ht 72.0 in | Wt 140.0 lb

## 2017-01-17 DIAGNOSIS — Z87891 Personal history of nicotine dependence: Secondary | ICD-10-CM | POA: Diagnosis not present

## 2017-01-17 DIAGNOSIS — J449 Chronic obstructive pulmonary disease, unspecified: Secondary | ICD-10-CM | POA: Insufficient documentation

## 2017-01-17 DIAGNOSIS — C3431 Malignant neoplasm of lower lobe, right bronchus or lung: Secondary | ICD-10-CM | POA: Diagnosis not present

## 2017-01-17 DIAGNOSIS — C3411 Malignant neoplasm of upper lobe, right bronchus or lung: Secondary | ICD-10-CM | POA: Diagnosis not present

## 2017-01-17 DIAGNOSIS — Z79899 Other long term (current) drug therapy: Secondary | ICD-10-CM | POA: Diagnosis not present

## 2017-01-17 DIAGNOSIS — Z510 Encounter for antineoplastic radiation therapy: Secondary | ICD-10-CM | POA: Diagnosis not present

## 2017-01-17 DIAGNOSIS — C3491 Malignant neoplasm of unspecified part of right bronchus or lung: Secondary | ICD-10-CM | POA: Diagnosis not present

## 2017-01-17 DIAGNOSIS — G4730 Sleep apnea, unspecified: Secondary | ICD-10-CM | POA: Diagnosis not present

## 2017-01-17 DIAGNOSIS — G4733 Obstructive sleep apnea (adult) (pediatric): Secondary | ICD-10-CM

## 2017-01-17 MED ORDER — TIOTROPIUM BROMIDE MONOHYDRATE 2.5 MCG/ACT IN AERS: 2.0000 | INHALATION_SPRAY | Freq: Every day | RESPIRATORY_TRACT | 0 refills | Status: DC

## 2017-01-17 MED ORDER — ALBUTEROL SULFATE HFA 108 (90 BASE) MCG/ACT IN AERS: 2.0000 | INHALATION_SPRAY | RESPIRATORY_TRACT | 5 refills | Status: DC | PRN

## 2017-01-17 NOTE — Progress Notes (Signed)
Subjective:    Patient ID: Harry Allen, male    DOB: March 04, 1941, 76 y.o.   MRN: [id number]  HPI 76 year old former smoker (55 pk-yrs) with newly diagnosed stage IIIa non-small cell lung cancer, likely adenocarcinoma with a right lower lobe mass and right hilar lymphadenopathy. The diagnosis was made in July 2018 by bronchoscopy after he had some scant hemoptysis. He is undergoing concurrent chemoradiation. His CT scan of the chest shows emphysematous changes in addition to his right upper lobe mass. He presents today to discuss COPD, dyspnea, cough. He has exertional dyspnea when he tries to bike, walk. Occasionally hears wheeze. He has cough that occurs most of the day, productive of thick clear mucous.   He tells me that he has OSA, has not been on CPAP for many years.    Review of Systems  Constitutional: Negative for fever and unexpected weight change.  HENT: Negative for congestion, dental problem, ear pain, nosebleeds, postnasal drip, rhinorrhea, sinus pressure, sneezing, sore throat and trouble swallowing.   Eyes: Negative for redness and itching.  Respiratory: Positive for cough and shortness of breath. Negative for chest tightness and wheezing.   Cardiovascular: Negative for palpitations and leg swelling.  Gastrointestinal: Negative for nausea and vomiting.  Genitourinary: Negative for dysuria.  Musculoskeletal: Negative for joint swelling.  Skin: Negative for rash.  Neurological: Negative for headaches.  Hematological: Does not bruise/bleed easily.  Psychiatric/Behavioral: Negative for dysphoric mood. The patient is not nervous/anxious.     Past Medical History:  Diagnosis Date  . Adenocarcinoma of right lung, stage 3 ([facility]) 12/19/2016  . Cancer ([facility])    lung cancer  . COPD (chronic obstructive pulmonary disease) ([facility])   . H/O hernia repair   . Sleep apnea      Family History  Problem Relation Age of Onset  . Cancer Father        pancreatic     Social History    Social History  . Marital status: Married    Spouse name: N/A  . Number of children: N/A  . Years of education: N/A   Occupational History  . Not on file.   Social History Main Topics  . Smoking status: Former Smoker    Packs/day: 1.00    Years: 8.00    Quit date: 11/19/2013  . Smokeless tobacco: Never Used  . Alcohol use No  . Drug use: No  . Sexual activity: No   Other Topics Concern  . Not on file   Social History Narrative  . No narrative on file  Worked as a professor, psychology, Economist.  Was in the [employer], lived in [place].  Has lived in [place], [place], [place], [place], [place], [place], [place]  Allergies  Allergen Reactions  . No Known Allergies      Outpatient Medications Prior to Visit  Medication Sig Dispense Refill  . omeprazole (PRILOSEC) 20 MG capsule Take 20 mg by mouth daily as needed (acid reflux).     . prochlorperazine (COMPAZINE) 10 MG tablet Take 1 tablet (10 mg total) by mouth every 6 (six) hours as needed for nausea or vomiting. (Patient not taking: Reported on 01/06/2017) 30 tablet 0   No facility-administered medications prior to visit.         Objective:   Physical Exam Vitals:   01/17/17 1440  BP: (!) 92/54  Pulse: 76  SpO2: 96%  Weight: 140 lb (63.5 kg)  Height: 6' (1.829 m)   Gen: Pleasant, thin, in no distress,  normal affect  ENT: No lesions,  mouth clear,  oropharynx clear, no postnasal drip  Neck: No JVD, no stridor  Lungs: No use of accessory muscles, distant but clear, no wheeze  Cardiovascular: RRR, heart sounds normal, no murmur or gallops, no peripheral edema  Musculoskeletal: No deformities, no cyanosis or clubbing  Neuro: alert, non focal  Skin: Warm, no lesions or rash      Assessment & Plan:  Obstructive sleep apnea Diagnosis made several years ago. He has never been able to tolerate CPAP. He is interested in possibly retrying with a simpler mask, nasal pillows. He will bring me his old sleep study information. It is several years  old and he may need a repeat study.  COPD (chronic obstructive pulmonary disease) (HCC) Clinical history consistent with COPD. He has emphysematous change on his CT scan of the chest. We will perform pulmonary function testing, quantify his obstruction. Start empiric Spiriva, albuterol as needed.  Adenocarcinoma of right lung, stage 3 (HCC) Following with Dr. Tammi Klippel and radiation therapy, Dr. Julien Nordmann in oncology.  Baltazar Apo, MD, PhD 01/17/2017, 3:10 PM [facility] [phone] or if no answer [phone]

## 2017-01-17 NOTE — Assessment & Plan Note (Signed)
Following with Dr. Tammi Klippel and radiation therapy, Dr. Julien Nordmann in oncology.

## 2017-01-17 NOTE — Assessment & Plan Note (Signed)
Diagnosis made several years ago. He has never been able to tolerate CPAP. He is interested in possibly retrying with a simpler mask, nasal pillows. He will bring me his old sleep study information. It is several years old and he may need a repeat study.

## 2017-01-17 NOTE — Patient Instructions (Signed)
Please start Spiriva respimat 2 sprays once a day. We will assess your response to this medication at your next visit We will teach you how to use albuterol. Use 2 puffs as needed for shortness of breath, wheezing, chest tightness We will perform full pulmonary function testing Please bring your old sleep study information with you next visit and we will review. Follow with Drs. Tammi Klippel and [place] [facility] as planned Follow with Dr Lamonte Sakai in 4-6 weeks

## 2017-01-17 NOTE — Assessment & Plan Note (Signed)
Clinical history consistent with COPD. He has emphysematous change on his CT scan of the chest. We will perform pulmonary function testing, quantify his obstruction. Start empiric Spiriva, albuterol as needed.

## 2017-01-18 ENCOUNTER — Ambulatory Visit: Payer: Medicare Other

## 2017-01-21 ENCOUNTER — Other Ambulatory Visit: Payer: Medicare Other

## 2017-01-21 ENCOUNTER — Ambulatory Visit
Admission: RE | Admit: 2017-01-21 | Discharge: 2017-01-21 | Disposition: A | Discharge status: Home / Self Care | Payer: Medicare Other | Source: Ambulatory Visit | Attending: Radiation Oncology | Admitting: Radiation Oncology

## 2017-01-21 ENCOUNTER — Ambulatory Visit: Payer: Medicare Other

## 2017-01-21 ENCOUNTER — Ambulatory Visit (HOSPITAL_BASED_OUTPATIENT_CLINIC_OR_DEPARTMENT_OTHER): Payer: Medicare Other

## 2017-01-21 ENCOUNTER — Ambulatory Visit: Payer: Medicare Other | Admitting: Oncology

## 2017-01-21 ENCOUNTER — Other Ambulatory Visit (HOSPITAL_BASED_OUTPATIENT_CLINIC_OR_DEPARTMENT_OTHER): Payer: Medicare Other

## 2017-01-21 ENCOUNTER — Encounter: Payer: Self-pay | Admitting: Oncology

## 2017-01-21 ENCOUNTER — Ambulatory Visit (HOSPITAL_BASED_OUTPATIENT_CLINIC_OR_DEPARTMENT_OTHER): Payer: Medicare Other | Admitting: Oncology

## 2017-01-21 VITALS — BP 107/64 | HR 72 | Temp 97.4°F | Resp 18 | Wt 145.8 lb

## 2017-01-21 DIAGNOSIS — C3411 Malignant neoplasm of upper lobe, right bronchus or lung: Secondary | ICD-10-CM

## 2017-01-21 DIAGNOSIS — Z510 Encounter for antineoplastic radiation therapy: Secondary | ICD-10-CM | POA: Diagnosis not present

## 2017-01-21 DIAGNOSIS — Z5111 Encounter for antineoplastic chemotherapy: Secondary | ICD-10-CM | POA: Diagnosis present

## 2017-01-21 DIAGNOSIS — C3491 Malignant neoplasm of unspecified part of right bronchus or lung: Secondary | ICD-10-CM

## 2017-01-21 DIAGNOSIS — C3431 Malignant neoplasm of lower lobe, right bronchus or lung: Secondary | ICD-10-CM

## 2017-01-21 DIAGNOSIS — Z79899 Other long term (current) drug therapy: Secondary | ICD-10-CM | POA: Diagnosis not present

## 2017-01-21 DIAGNOSIS — J449 Chronic obstructive pulmonary disease, unspecified: Secondary | ICD-10-CM | POA: Diagnosis not present

## 2017-01-21 DIAGNOSIS — G4730 Sleep apnea, unspecified: Secondary | ICD-10-CM | POA: Diagnosis not present

## 2017-01-21 DIAGNOSIS — Z87891 Personal history of nicotine dependence: Secondary | ICD-10-CM | POA: Diagnosis not present

## 2017-01-21 LAB — CBC WITH DIFFERENTIAL/PLATELET
BASO%: 0.7 % (ref 0.0–2.0)
BASOS ABS: 0 10*3/uL (ref 0.0–0.1)
EOS ABS: 0.1 10*3/uL (ref 0.0–0.5)
EOS%: 1.6 % (ref 0.0–7.0)
HCT: 35.4 % — ABNORMAL LOW (ref 38.4–49.9)
HGB: 12 g/dL — ABNORMAL LOW (ref 13.0–17.1)
LYMPH#: 0.5 10*3/uL — AB (ref 0.9–3.3)
LYMPH%: 10.7 % — ABNORMAL LOW (ref 14.0–49.0)
MCH: 30.4 pg (ref 27.2–33.4)
MCHC: 34 g/dL (ref 32.0–36.0)
MCV: 89.5 fL (ref 79.3–98.0)
MONO#: 0.7 10*3/uL (ref 0.1–0.9)
MONO%: 14.2 % — AB (ref 0.0–14.0)
NEUT%: 72.8 % (ref 39.0–75.0)
NEUTROS ABS: 3.7 10*3/uL (ref 1.5–6.5)
Platelets: 304 10*3/uL (ref 140–400)
RBC: 3.95 10*6/uL — AB (ref 4.20–5.82)
RDW: 13.3 % (ref 11.0–14.6)
WBC: 5.1 10*3/uL (ref 4.0–10.3)

## 2017-01-21 LAB — COMPREHENSIVE METABOLIC PANEL
ALBUMIN: 3.2 g/dL — AB (ref 3.5–5.0)
ALK PHOS: 90 U/L (ref 40–150)
ALT: 9 U/L (ref 0–55)
AST: 16 U/L (ref 5–34)
Anion Gap: 5 mEq/L (ref 3–11)
BILIRUBIN TOTAL: 0.47 mg/dL (ref 0.20–1.20)
BUN: 11.6 mg/dL (ref 7.0–26.0)
CO2: 28 mEq/L (ref 22–29)
CREATININE: 0.8 mg/dL (ref 0.7–1.3)
Calcium: 9.7 mg/dL (ref 8.4–10.4)
Chloride: 102 mEq/L (ref 98–109)
GLUCOSE: 85 mg/dL (ref 70–140)
Potassium: 4.6 mEq/L (ref 3.5–5.1)
SODIUM: 135 meq/L — AB (ref 136–145)
TOTAL PROTEIN: 7.9 g/dL (ref 6.4–8.3)

## 2017-01-21 MED ORDER — DIPHENHYDRAMINE HCL 50 MG/ML IJ SOLN
INTRAMUSCULAR | Status: AC
  Filled 2017-01-21: qty 1

## 2017-01-21 MED ORDER — SODIUM CHLORIDE 0.9 % IV SOLN
161.2000 mg | Freq: Once | INTRAVENOUS | Status: AC
  Administered 2017-01-21: 160 mg via INTRAVENOUS
  Filled 2017-01-21: qty 16

## 2017-01-21 MED ORDER — SODIUM CHLORIDE 0.9 % IV SOLN
20.0000 mg | Freq: Once | INTRAVENOUS | Status: AC
  Administered 2017-01-21: 20 mg via INTRAVENOUS
  Filled 2017-01-21: qty 2

## 2017-01-21 MED ORDER — FAMOTIDINE IN NACL 20-0.9 MG/50ML-% IV SOLN
INTRAVENOUS | Status: AC
  Filled 2017-01-21: qty 50

## 2017-01-21 MED ORDER — PALONOSETRON HCL INJECTION 0.25 MG/5ML
INTRAVENOUS | Status: AC
  Filled 2017-01-21: qty 5

## 2017-01-21 MED ORDER — DIPHENHYDRAMINE HCL 50 MG/ML IJ SOLN
25.0000 mg | Freq: Once | INTRAMUSCULAR | Status: AC
  Administered 2017-01-21: 25 mg via INTRAVENOUS

## 2017-01-21 MED ORDER — PALONOSETRON HCL INJECTION 0.25 MG/5ML
0.2500 mg | Freq: Once | INTRAVENOUS | Status: AC
  Administered 2017-01-21: 0.25 mg via INTRAVENOUS

## 2017-01-21 MED ORDER — FAMOTIDINE IN NACL 20-0.9 MG/50ML-% IV SOLN
20.0000 mg | Freq: Once | INTRAVENOUS | Status: AC
  Administered 2017-01-21: 20 mg via INTRAVENOUS

## 2017-01-21 MED ORDER — SODIUM CHLORIDE 0.9 % IV SOLN
Freq: Once | INTRAVENOUS | Status: AC
  Administered 2017-01-21: 14:00:00 via INTRAVENOUS

## 2017-01-21 MED ORDER — PACLITAXEL CHEMO INJECTION 300 MG/50ML
45.0000 mg/m2 | Freq: Once | INTRAVENOUS | Status: AC
  Administered 2017-01-21: 78 mg via INTRAVENOUS
  Filled 2017-01-21: qty 13

## 2017-01-21 NOTE — Progress Notes (Signed)
[facility] Follow up:    Harry Noon, [place] [id number]   DIAGNOSIS: Stage IIIA (T4, N1, M0) non-small cell lung cancer, favoring adenocarcinoma presented with large right lower lobe lung mass in addition to right hilar lymphadenopathy and right upper lobe apical pulmonary nodule diagnosed in July 2018.  SUMMARY OF ONCOLOGIC HISTORY:  No history exists.    CURRENT THERAPY: Concurrent chemoradiation with weekly carboplatin for AUC of 2 and paclitaxel 45 MG/M2. First dose 12/30/2016. Status post 3 cycles.  INTERVAL HISTORY: Harry Allen 76 y.o. male returns for [facility] for routine follow-up with his wife. The patient is feeling fine today with no specific complaints except for persistent cough productive of blood-tinged sputum. His cough is unchanged from baseline. The patient continues to tolerate his chemotherapy well, however, his wife noted that he was more dizzy during the infusion last time and thinks it is related to the Benadryl. She has inquired about a dose reduction of the Benadryl.Marland Kitchen He denied having any fevers or chills. He denies chest pain and shortness of breath. Denies abdominal pain, nausea, vomiting. Denies constipation and diarrhea. Denies neuropathy. The patient is here for evaluation prior to cycle 4 of his chemotherapy.   Patient Active Problem List   Diagnosis Date Noted  . COPD (chronic obstructive pulmonary disease) ([facility]) 01/17/2017  . Obstructive sleep apnea 01/17/2017  . Encounter for antineoplastic chemotherapy 12/21/2016  . Goals of care, counseling/discussion 12/21/2016  . Adenocarcinoma of right lung, stage 3 ([facility]) 12/19/2016  . Lung mass 11/11/2016  . SACROILIAC JOINT DYSFUNCTION 07/14/2007    is allergic to no known allergies.  MEDICAL HISTORY: Past Medical History:  Diagnosis Date  . Adenocarcinoma of right lung, stage 3 ([facility]) 12/19/2016  . Cancer ([place])    lung cancer  . COPD (chronic obstructive  pulmonary disease) ([facility])   . H/O hernia repair   . Sleep apnea     SURGICAL HISTORY: Past Surgical History:  Procedure Laterality Date  . APPENDECTOMY    . HERNIA REPAIR    . HYDROCELE EXCISION     on neck; 1968  . VIDEO BRONCHOSCOPY WITH ENDOBRONCHIAL ULTRASOUND N/A 12/09/2016   Procedure: VIDEO BRONCHOSCOPY WITH ENDOBRONCHIAL ULTRASOUND;  Surgeon: [name], [place];  Location: [facility];  Service: Thoracic;  Laterality: N/A;    SOCIAL HISTORY: Social History   Social History  . Marital status: Married    Spouse name: N/A  . Number of children: N/A  . Years of education: N/A   Occupational History  . Not on file.   Social History Main Topics  . Smoking status: Former Smoker    Packs/day: 1.00    Years: 8.00    Quit date: 11/19/2013  . Smokeless tobacco: Never Used  . Alcohol use No  . Drug use: No  . Sexual activity: No   Other Topics Concern  . Not on file   Social History Narrative  . No narrative on file    FAMILY HISTORY: Family History  Problem Relation Age of Onset  . Cancer Father        pancreatic    Review of Systems  Constitutional: Negative.   HENT:  Negative.   Eyes: Negative.   Respiratory: Negative for shortness of breath and wheezing.        Blood-tinged sputum.  Cardiovascular: Negative.   Gastrointestinal: Negative.   Genitourinary: Negative.    Musculoskeletal: Negative.   Skin: Negative.   Neurological: Negative.  Hematological: Negative.   Psychiatric/Behavioral: Negative.       PHYSICAL EXAMINATION  ECOG PERFORMANCE STATUS: 1 - Symptomatic but completely ambulatory  Vitals:   01/21/17 1327  BP: 107/64  Pulse: 72  Resp: 18  Temp: (!) 97.4 F (36.3 C)  SpO2: 97%    Physical Exam  Constitutional: He is oriented to person, place, and time and well-developed, well-nourished, and in no distress. No distress.  HENT:  Head: Normocephalic.  Mouth/Throat: Oropharynx is clear and moist. No oropharyngeal exudate.   Eyes: Conjunctivae are normal. No scleral icterus.  Neck: Normal range of motion. Neck supple.  Cardiovascular: Normal rate, regular rhythm, normal heart sounds and intact distal pulses.   Pulmonary/Chest: Effort normal and breath sounds normal. No respiratory distress. He has no wheezes. He has no rales.  Abdominal: Soft. Bowel sounds are normal. He exhibits no distension and no mass. There is no tenderness.  Musculoskeletal: Normal range of motion. He exhibits no edema.  Lymphadenopathy:    He has no cervical adenopathy.  Neurological: He is alert and oriented to person, place, and time. Gait normal.  Skin: Skin is warm and dry. No rash noted. He is not diaphoretic. No erythema. No pallor.  Psychiatric: Mood, memory, affect and judgment normal.    LABORATORY DATA:  CBC    Component Value Date/Time   WBC 5.1 01/21/2017 1225   WBC 10.4 12/04/2016 1553   RBC 3.95 (L) 01/21/2017 1225   RBC 4.42 12/04/2016 1553   HGB 12.0 (L) 01/21/2017 1225   HCT 35.4 (L) 01/21/2017 1225   PLT 304 01/21/2017 1225   MCV 89.5 01/21/2017 1225   MCH 30.4 01/21/2017 1225   MCH 29.6 12/04/2016 1553   MCHC 34.0 01/21/2017 1225   MCHC 33.7 12/04/2016 1553   RDW 13.3 01/21/2017 1225   LYMPHSABS 0.5 (L) 01/21/2017 1225   MONOABS 0.7 01/21/2017 1225   EOSABS 0.1 01/21/2017 1225   BASOSABS 0.0 01/21/2017 1225    CMP     Component Value Date/Time   NA 135 (L) 01/21/2017 1225   K 4.6 01/21/2017 1225   CL 102 12/04/2016 1553   CO2 28 01/21/2017 1225   GLUCOSE 85 01/21/2017 1225   BUN 11.6 01/21/2017 1225   CREATININE 0.8 01/21/2017 1225   CALCIUM 9.7 01/21/2017 1225   PROT 7.9 01/21/2017 1225   ALBUMIN 3.2 (L) 01/21/2017 1225   AST 16 01/21/2017 1225   ALT 9 01/21/2017 1225   ALKPHOS 90 01/21/2017 1225   BILITOT 0.47 01/21/2017 1225   GFRNONAA >60 12/04/2016 1553   GFRAA >60 12/04/2016 1553    RADIOGRAPHIC STUDIES:  No results found.  ASSESSMENT and THERAPY PLAN:   Adenocarcinoma of  right lung, stage 3 ([facility]) This is a very pleasant 76 year old African-American male recently diagnosed with a stage IIIA(T4, N1, M0) non-small cell lung cancer, favoring adenocarcinoma diagnosed in July 2018. The patient is currently undergoing a course of concurrent chemoradiation with weekly carboplatin for AUC of 2 and paclitaxel 45 MG/M2.   Patient was seen with Dr. Julien Nordmann. The patient is tolerating his chemotherapy well. Recommend that he proceed with cycle 4 today as scheduled. We will reduce the dose of the Benadryl given as a premedication from 50 mg down to 25 mg.  He will follow-up next week for labs and chemotherapy and in 2 weeks for labs, visit, and chemotherapy.    No orders of the defined types were placed in this encounter.   All questions were answered.  The patient knows to call the clinic with any problems, questions or concerns. We can certainly see the patient much sooner if necessary.  Mikey Bussing, NP 01/22/2017   ADDENDUM: Hematology/Oncology Attending: I had a face to face encounter with the patient. I recommended his care plan. This is a very pleasant 76 years old African-American male with a stage IIIA non-small cell lung cancer, adenocarcinoma. The patient is currently undergoing a course of concurrent chemoradiation with weekly carboplatin and paclitaxel is status post 3 cycles. He has been tolerating his treatment fairly well with no significant adverse effects except for jittery feeling after his Benadryl dose. I recommended for the patient to proceed with cycle #4 today as a scheduled. I will decrease his dose of Benadryl 225 mg with the chemotherapy. He would come back for follow-up visit in 2 weeks for evaluation before starting cycle #6. The patient was advised to call immediately if he has any concerning symptoms in the interval.  Disclaimer: This note was dictated with voice recognition software. Similar sounding words can inadvertently be transcribed  and may be missed upon review. Eilleen Kempf, MD 01/25/17

## 2017-01-21 NOTE — Patient Instructions (Addendum)
[facility] Discharge Instructions for Patients Receiving Chemotherapy  Today you received the following chemotherapy agents Carboplatin, Taxol.   To help prevent nausea and vomiting after your treatment, we encourage you to take your nausea medication as prescribed. NO ZOFRAN FOR 3 DAYS.    If you develop nausea and vomiting that is not controlled by your nausea medication, call the clinic.   BELOW ARE SYMPTOMS THAT SHOULD BE REPORTED IMMEDIATELY:  *FEVER GREATER THAN 100.5 F  *CHILLS WITH OR WITHOUT FEVER  NAUSEA AND VOMITING THAT IS NOT CONTROLLED WITH YOUR NAUSEA MEDICATION  *UNUSUAL SHORTNESS OF BREATH  *UNUSUAL BRUISING OR BLEEDING  TENDERNESS IN MOUTH AND THROAT WITH OR WITHOUT PRESENCE OF ULCERS  *URINARY PROBLEMS  *BOWEL PROBLEMS  UNUSUAL RASH Items with * indicate a potential emergency and should be followed up as soon as possible.  Feel free to call the clinic you have any questions or concerns. The clinic phone number is [phone].  Please show the [facility] at check-in to the Emergency Department and triage nurse.

## 2017-01-22 ENCOUNTER — Ambulatory Visit
Admission: RE | Admit: 2017-01-22 | Discharge: 2017-01-22 | Disposition: A | Discharge status: Home / Self Care | Payer: Medicare Other | Source: Ambulatory Visit | Attending: Radiation Oncology | Admitting: Radiation Oncology

## 2017-01-22 ENCOUNTER — Ambulatory Visit: Payer: Medicare Other

## 2017-01-22 DIAGNOSIS — C3411 Malignant neoplasm of upper lobe, right bronchus or lung: Secondary | ICD-10-CM | POA: Diagnosis not present

## 2017-01-22 DIAGNOSIS — Z79899 Other long term (current) drug therapy: Secondary | ICD-10-CM | POA: Diagnosis not present

## 2017-01-22 DIAGNOSIS — Z510 Encounter for antineoplastic radiation therapy: Secondary | ICD-10-CM | POA: Diagnosis not present

## 2017-01-22 DIAGNOSIS — C3431 Malignant neoplasm of lower lobe, right bronchus or lung: Secondary | ICD-10-CM | POA: Diagnosis not present

## 2017-01-22 DIAGNOSIS — Z87891 Personal history of nicotine dependence: Secondary | ICD-10-CM | POA: Diagnosis not present

## 2017-01-22 DIAGNOSIS — J449 Chronic obstructive pulmonary disease, unspecified: Secondary | ICD-10-CM | POA: Diagnosis not present

## 2017-01-22 DIAGNOSIS — G4730 Sleep apnea, unspecified: Secondary | ICD-10-CM | POA: Diagnosis not present

## 2017-01-22 NOTE — Assessment & Plan Note (Signed)
This is a very pleasant 76 year old African-American male recently diagnosed with a stage IIIA(T4, N1, M0) non-small cell lung cancer, favoring adenocarcinoma diagnosed in July 2018. The patient is currently undergoing a course of concurrent chemoradiation with weekly carboplatin for AUC of 2 and paclitaxel 45 MG/M2.   Patient was seen with Dr. Julien Nordmann. The patient is tolerating his chemotherapy well. Recommend that he proceed with cycle 4 today as scheduled. We will reduce the dose of the Benadryl given as a premedication from 50 mg down to 25 mg.  He will follow-up next week for labs and chemotherapy and in 2 weeks for labs, visit, and chemotherapy.

## 2017-01-23 ENCOUNTER — Ambulatory Visit
Admission: RE | Admit: 2017-01-23 | Discharge: 2017-01-23 | Disposition: A | Discharge status: Home / Self Care | Payer: Medicare Other | Source: Ambulatory Visit | Attending: Radiation Oncology | Admitting: Radiation Oncology

## 2017-01-23 ENCOUNTER — Ambulatory Visit: Payer: Medicare Other

## 2017-01-23 DIAGNOSIS — Z79899 Other long term (current) drug therapy: Secondary | ICD-10-CM | POA: Diagnosis not present

## 2017-01-23 DIAGNOSIS — Z510 Encounter for antineoplastic radiation therapy: Secondary | ICD-10-CM | POA: Diagnosis not present

## 2017-01-23 DIAGNOSIS — J449 Chronic obstructive pulmonary disease, unspecified: Secondary | ICD-10-CM | POA: Diagnosis not present

## 2017-01-23 DIAGNOSIS — C3431 Malignant neoplasm of lower lobe, right bronchus or lung: Secondary | ICD-10-CM | POA: Diagnosis not present

## 2017-01-23 DIAGNOSIS — Z87891 Personal history of nicotine dependence: Secondary | ICD-10-CM | POA: Diagnosis not present

## 2017-01-23 DIAGNOSIS — C3411 Malignant neoplasm of upper lobe, right bronchus or lung: Secondary | ICD-10-CM | POA: Diagnosis not present

## 2017-01-23 DIAGNOSIS — G4730 Sleep apnea, unspecified: Secondary | ICD-10-CM | POA: Diagnosis not present

## 2017-01-24 ENCOUNTER — Telehealth: Payer: Self-pay | Admitting: Oncology

## 2017-01-24 ENCOUNTER — Ambulatory Visit
Admission: RE | Admit: 2017-01-24 | Discharge: 2017-01-24 | Disposition: A | Discharge status: Home / Self Care | Payer: Medicare Other | Source: Ambulatory Visit | Attending: Radiation Oncology | Admitting: Radiation Oncology

## 2017-01-24 ENCOUNTER — Ambulatory Visit: Payer: Medicare Other

## 2017-01-24 DIAGNOSIS — Z510 Encounter for antineoplastic radiation therapy: Secondary | ICD-10-CM | POA: Diagnosis not present

## 2017-01-24 DIAGNOSIS — J449 Chronic obstructive pulmonary disease, unspecified: Secondary | ICD-10-CM | POA: Diagnosis not present

## 2017-01-24 DIAGNOSIS — Z79899 Other long term (current) drug therapy: Secondary | ICD-10-CM | POA: Diagnosis not present

## 2017-01-24 DIAGNOSIS — G4730 Sleep apnea, unspecified: Secondary | ICD-10-CM | POA: Diagnosis not present

## 2017-01-24 DIAGNOSIS — C3491 Malignant neoplasm of unspecified part of right bronchus or lung: Secondary | ICD-10-CM

## 2017-01-24 DIAGNOSIS — Z87891 Personal history of nicotine dependence: Secondary | ICD-10-CM | POA: Diagnosis not present

## 2017-01-24 DIAGNOSIS — C3431 Malignant neoplasm of lower lobe, right bronchus or lung: Secondary | ICD-10-CM | POA: Diagnosis not present

## 2017-01-24 DIAGNOSIS — C3411 Malignant neoplasm of upper lobe, right bronchus or lung: Secondary | ICD-10-CM | POA: Diagnosis not present

## 2017-01-24 MED ORDER — RADIAPLEXRX EX GEL
Freq: Once | CUTANEOUS | Status: AC
  Administered 2017-01-24: 16:00:00 via TOPICAL

## 2017-01-24 NOTE — Telephone Encounter (Signed)
No 9/4 los.

## 2017-01-25 ENCOUNTER — Ambulatory Visit: Payer: Medicare Other

## 2017-01-27 ENCOUNTER — Ambulatory Visit
Admission: RE | Admit: 2017-01-27 | Discharge: 2017-01-27 | Disposition: A | Discharge status: Home / Self Care | Payer: Medicare Other | Source: Ambulatory Visit | Attending: Radiation Oncology | Admitting: Radiation Oncology

## 2017-01-27 ENCOUNTER — Other Ambulatory Visit (HOSPITAL_BASED_OUTPATIENT_CLINIC_OR_DEPARTMENT_OTHER): Payer: Medicare Other

## 2017-01-27 ENCOUNTER — Other Ambulatory Visit: Payer: Medicare Other

## 2017-01-27 ENCOUNTER — Ambulatory Visit: Payer: Medicare Other

## 2017-01-27 ENCOUNTER — Ambulatory Visit (HOSPITAL_BASED_OUTPATIENT_CLINIC_OR_DEPARTMENT_OTHER): Payer: Medicare Other

## 2017-01-27 VITALS — BP 108/62 | HR 64 | Temp 97.6°F | Resp 18

## 2017-01-27 DIAGNOSIS — Z87891 Personal history of nicotine dependence: Secondary | ICD-10-CM | POA: Diagnosis not present

## 2017-01-27 DIAGNOSIS — J449 Chronic obstructive pulmonary disease, unspecified: Secondary | ICD-10-CM | POA: Diagnosis not present

## 2017-01-27 DIAGNOSIS — G4730 Sleep apnea, unspecified: Secondary | ICD-10-CM | POA: Diagnosis not present

## 2017-01-27 DIAGNOSIS — C3431 Malignant neoplasm of lower lobe, right bronchus or lung: Secondary | ICD-10-CM

## 2017-01-27 DIAGNOSIS — C3491 Malignant neoplasm of unspecified part of right bronchus or lung: Secondary | ICD-10-CM

## 2017-01-27 DIAGNOSIS — Z79899 Other long term (current) drug therapy: Secondary | ICD-10-CM | POA: Diagnosis not present

## 2017-01-27 DIAGNOSIS — Z5111 Encounter for antineoplastic chemotherapy: Secondary | ICD-10-CM | POA: Diagnosis present

## 2017-01-27 DIAGNOSIS — Z510 Encounter for antineoplastic radiation therapy: Secondary | ICD-10-CM | POA: Diagnosis not present

## 2017-01-27 DIAGNOSIS — C3411 Malignant neoplasm of upper lobe, right bronchus or lung: Secondary | ICD-10-CM | POA: Diagnosis not present

## 2017-01-27 LAB — COMPREHENSIVE METABOLIC PANEL
ALBUMIN: 3.3 g/dL — AB (ref 3.5–5.0)
ALK PHOS: 86 U/L (ref 40–150)
ALT: 7 U/L (ref 0–55)
AST: 15 U/L (ref 5–34)
Anion Gap: 8 mEq/L (ref 3–11)
BILIRUBIN TOTAL: 0.54 mg/dL (ref 0.20–1.20)
BUN: 11.5 mg/dL (ref 7.0–26.0)
CO2: 23 mEq/L (ref 22–29)
CREATININE: 0.8 mg/dL (ref 0.7–1.3)
Calcium: 9.5 mg/dL (ref 8.4–10.4)
Chloride: 102 mEq/L (ref 98–109)
EGFR: >90 mL/min/{1.73_m2} (ref >90)
GLUCOSE: 83 mg/dL (ref 70–140)
Potassium: 4.2 mEq/L (ref 3.5–5.1)
SODIUM: 134 meq/L — AB (ref 136–145)
TOTAL PROTEIN: 7.9 g/dL (ref 6.4–8.3)

## 2017-01-27 LAB — CBC WITH DIFFERENTIAL/PLATELET
BASO%: 1 % (ref 0.0–2.0)
Basophils Absolute: 0 10*3/uL (ref 0.0–0.1)
EOS%: 1.9 % (ref 0.0–7.0)
Eosinophils Absolute: 0.1 10*3/uL (ref 0.0–0.5)
HCT: 37.9 % — ABNORMAL LOW (ref 38.4–49.9)
HEMOGLOBIN: 12.8 g/dL — AB (ref 13.0–17.1)
LYMPH#: 0.5 10*3/uL — AB (ref 0.9–3.3)
LYMPH%: 12.4 % — ABNORMAL LOW (ref 14.0–49.0)
MCH: 30.1 pg (ref 27.2–33.4)
MCHC: 33.8 g/dL (ref 32.0–36.0)
MCV: 89.2 fL (ref 79.3–98.0)
MONO#: 0.4 10*3/uL (ref 0.1–0.9)
MONO%: 9.3 % (ref 0.0–14.0)
NEUT#: 3.2 10*3/uL (ref 1.5–6.5)
NEUT%: 75.4 % — ABNORMAL HIGH (ref 39.0–75.0)
NRBC: 0 % (ref 0–0)
Platelets: 198 10*3/uL (ref 140–400)
RBC: 4.25 10*6/uL (ref 4.20–5.82)
RDW: 13.6 % (ref 11.0–14.6)
WBC: 4.2 10*3/uL (ref 4.0–10.3)

## 2017-01-27 MED ORDER — SODIUM CHLORIDE 0.9 % IV SOLN
Freq: Once | INTRAVENOUS | Status: AC
  Administered 2017-01-27: 15:00:00 via INTRAVENOUS

## 2017-01-27 MED ORDER — PALONOSETRON HCL INJECTION 0.25 MG/5ML
0.2500 mg | Freq: Once | INTRAVENOUS | Status: AC
  Administered 2017-01-27: 0.25 mg via INTRAVENOUS

## 2017-01-27 MED ORDER — SODIUM CHLORIDE 0.9 % IV SOLN
20.0000 mg | Freq: Once | INTRAVENOUS | Status: AC
  Administered 2017-01-27: 20 mg via INTRAVENOUS
  Filled 2017-01-27: qty 2

## 2017-01-27 MED ORDER — PACLITAXEL CHEMO INJECTION 300 MG/50ML
45.0000 mg/m2 | Freq: Once | INTRAVENOUS | Status: AC
  Administered 2017-01-27: 78 mg via INTRAVENOUS
  Filled 2017-01-27: qty 13

## 2017-01-27 MED ORDER — FAMOTIDINE IN NACL 20-0.9 MG/50ML-% IV SOLN
INTRAVENOUS | Status: AC
  Filled 2017-01-27: qty 50

## 2017-01-27 MED ORDER — DIPHENHYDRAMINE HCL 50 MG/ML IJ SOLN
INTRAMUSCULAR | Status: AC
  Filled 2017-01-27: qty 1

## 2017-01-27 MED ORDER — SODIUM CHLORIDE 0.9 % IV SOLN
161.2000 mg | Freq: Once | INTRAVENOUS | Status: AC
  Administered 2017-01-27: 160 mg via INTRAVENOUS
  Filled 2017-01-27: qty 16

## 2017-01-27 MED ORDER — PALONOSETRON HCL INJECTION 0.25 MG/5ML
INTRAVENOUS | Status: AC
  Filled 2017-01-27: qty 5

## 2017-01-27 MED ORDER — FAMOTIDINE IN NACL 20-0.9 MG/50ML-% IV SOLN
20.0000 mg | Freq: Once | INTRAVENOUS | Status: AC
  Administered 2017-01-27: 20 mg via INTRAVENOUS

## 2017-01-27 MED ORDER — DIPHENHYDRAMINE HCL 50 MG/ML IJ SOLN
25.0000 mg | Freq: Once | INTRAMUSCULAR | Status: AC
  Administered 2017-01-27: 25 mg via INTRAVENOUS

## 2017-01-28 ENCOUNTER — Ambulatory Visit
Admission: RE | Admit: 2017-01-28 | Discharge: 2017-01-28 | Disposition: A | Discharge status: Home / Self Care | Payer: Medicare Other | Source: Ambulatory Visit | Attending: Radiation Oncology | Admitting: Radiation Oncology

## 2017-01-28 ENCOUNTER — Ambulatory Visit: Payer: Medicare Other

## 2017-01-28 DIAGNOSIS — Z87891 Personal history of nicotine dependence: Secondary | ICD-10-CM | POA: Diagnosis not present

## 2017-01-28 DIAGNOSIS — G4730 Sleep apnea, unspecified: Secondary | ICD-10-CM | POA: Diagnosis not present

## 2017-01-28 DIAGNOSIS — C3411 Malignant neoplasm of upper lobe, right bronchus or lung: Secondary | ICD-10-CM | POA: Diagnosis not present

## 2017-01-28 DIAGNOSIS — Z510 Encounter for antineoplastic radiation therapy: Secondary | ICD-10-CM | POA: Diagnosis not present

## 2017-01-28 DIAGNOSIS — C3431 Malignant neoplasm of lower lobe, right bronchus or lung: Secondary | ICD-10-CM | POA: Diagnosis not present

## 2017-01-28 DIAGNOSIS — Z79899 Other long term (current) drug therapy: Secondary | ICD-10-CM | POA: Diagnosis not present

## 2017-01-28 DIAGNOSIS — J449 Chronic obstructive pulmonary disease, unspecified: Secondary | ICD-10-CM | POA: Diagnosis not present

## 2017-01-29 ENCOUNTER — Ambulatory Visit: Payer: Medicare Other

## 2017-01-29 ENCOUNTER — Ambulatory Visit
Admission: RE | Admit: 2017-01-29 | Discharge: 2017-01-29 | Disposition: A | Discharge status: Home / Self Care | Payer: Medicare Other | Source: Ambulatory Visit | Attending: Radiation Oncology | Admitting: Radiation Oncology

## 2017-01-29 DIAGNOSIS — Z510 Encounter for antineoplastic radiation therapy: Secondary | ICD-10-CM | POA: Diagnosis not present

## 2017-01-29 DIAGNOSIS — J449 Chronic obstructive pulmonary disease, unspecified: Secondary | ICD-10-CM | POA: Diagnosis not present

## 2017-01-29 DIAGNOSIS — C3431 Malignant neoplasm of lower lobe, right bronchus or lung: Secondary | ICD-10-CM | POA: Diagnosis not present

## 2017-01-29 DIAGNOSIS — Z87891 Personal history of nicotine dependence: Secondary | ICD-10-CM | POA: Diagnosis not present

## 2017-01-29 DIAGNOSIS — G4730 Sleep apnea, unspecified: Secondary | ICD-10-CM | POA: Diagnosis not present

## 2017-01-29 DIAGNOSIS — C3492 Malignant neoplasm of unspecified part of left bronchus or lung: Secondary | ICD-10-CM | POA: Diagnosis not present

## 2017-01-29 DIAGNOSIS — C3411 Malignant neoplasm of upper lobe, right bronchus or lung: Secondary | ICD-10-CM | POA: Diagnosis not present

## 2017-01-29 DIAGNOSIS — E785 Hyperlipidemia, unspecified: Secondary | ICD-10-CM | POA: Diagnosis not present

## 2017-01-29 DIAGNOSIS — Z79899 Other long term (current) drug therapy: Secondary | ICD-10-CM | POA: Diagnosis not present

## 2017-01-30 ENCOUNTER — Ambulatory Visit: Payer: Medicare Other

## 2017-01-30 ENCOUNTER — Ambulatory Visit
Admission: RE | Admit: 2017-01-30 | Discharge: 2017-01-30 | Disposition: A | Discharge status: Home / Self Care | Payer: Medicare Other | Source: Ambulatory Visit | Attending: Radiation Oncology | Admitting: Radiation Oncology

## 2017-01-30 DIAGNOSIS — G4730 Sleep apnea, unspecified: Secondary | ICD-10-CM | POA: Diagnosis not present

## 2017-01-30 DIAGNOSIS — Z87891 Personal history of nicotine dependence: Secondary | ICD-10-CM | POA: Diagnosis not present

## 2017-01-30 DIAGNOSIS — Z79899 Other long term (current) drug therapy: Secondary | ICD-10-CM | POA: Diagnosis not present

## 2017-01-30 DIAGNOSIS — C3411 Malignant neoplasm of upper lobe, right bronchus or lung: Secondary | ICD-10-CM | POA: Diagnosis not present

## 2017-01-30 DIAGNOSIS — C3431 Malignant neoplasm of lower lobe, right bronchus or lung: Secondary | ICD-10-CM | POA: Diagnosis not present

## 2017-01-30 DIAGNOSIS — Z510 Encounter for antineoplastic radiation therapy: Secondary | ICD-10-CM | POA: Diagnosis not present

## 2017-01-30 DIAGNOSIS — J449 Chronic obstructive pulmonary disease, unspecified: Secondary | ICD-10-CM | POA: Diagnosis not present

## 2017-01-31 ENCOUNTER — Ambulatory Visit: Payer: Medicare Other

## 2017-01-31 ENCOUNTER — Ambulatory Visit
Admission: RE | Admit: 2017-01-31 | Discharge: 2017-01-31 | Disposition: A | Discharge status: Home / Self Care | Payer: Medicare Other | Source: Ambulatory Visit | Attending: Radiation Oncology | Admitting: Radiation Oncology

## 2017-01-31 DIAGNOSIS — C3431 Malignant neoplasm of lower lobe, right bronchus or lung: Secondary | ICD-10-CM | POA: Diagnosis not present

## 2017-01-31 DIAGNOSIS — Z79899 Other long term (current) drug therapy: Secondary | ICD-10-CM | POA: Diagnosis not present

## 2017-01-31 DIAGNOSIS — I4510 Unspecified right bundle-branch block: Secondary | ICD-10-CM | POA: Diagnosis not present

## 2017-01-31 DIAGNOSIS — Z87891 Personal history of nicotine dependence: Secondary | ICD-10-CM | POA: Diagnosis not present

## 2017-01-31 DIAGNOSIS — J449 Chronic obstructive pulmonary disease, unspecified: Secondary | ICD-10-CM | POA: Diagnosis not present

## 2017-01-31 DIAGNOSIS — C3411 Malignant neoplasm of upper lobe, right bronchus or lung: Secondary | ICD-10-CM | POA: Diagnosis not present

## 2017-01-31 DIAGNOSIS — G4730 Sleep apnea, unspecified: Secondary | ICD-10-CM | POA: Diagnosis not present

## 2017-01-31 DIAGNOSIS — Z510 Encounter for antineoplastic radiation therapy: Secondary | ICD-10-CM | POA: Diagnosis not present

## 2017-01-31 DIAGNOSIS — I2510 Atherosclerotic heart disease of native coronary artery without angina pectoris: Secondary | ICD-10-CM | POA: Diagnosis not present

## 2017-02-03 ENCOUNTER — Ambulatory Visit: Payer: Medicare Other | Admitting: Oncology

## 2017-02-03 ENCOUNTER — Encounter: Payer: Self-pay | Admitting: Oncology

## 2017-02-03 ENCOUNTER — Other Ambulatory Visit: Payer: Medicare Other

## 2017-02-03 ENCOUNTER — Ambulatory Visit
Admission: RE | Admit: 2017-02-03 | Discharge: 2017-02-03 | Disposition: A | Discharge status: Home / Self Care | Payer: Medicare Other | Source: Ambulatory Visit | Attending: Radiation Oncology | Admitting: Radiation Oncology

## 2017-02-03 ENCOUNTER — Other Ambulatory Visit (HOSPITAL_BASED_OUTPATIENT_CLINIC_OR_DEPARTMENT_OTHER): Payer: Medicare Other

## 2017-02-03 ENCOUNTER — Ambulatory Visit: Payer: Medicare Other

## 2017-02-03 ENCOUNTER — Telehealth: Payer: Self-pay | Admitting: Internal Medicine

## 2017-02-03 ENCOUNTER — Ambulatory Visit (HOSPITAL_BASED_OUTPATIENT_CLINIC_OR_DEPARTMENT_OTHER): Payer: Medicare Other | Admitting: Oncology

## 2017-02-03 ENCOUNTER — Ambulatory Visit (HOSPITAL_BASED_OUTPATIENT_CLINIC_OR_DEPARTMENT_OTHER): Payer: Medicare Other

## 2017-02-03 VITALS — BP 102/58 | HR 65 | Resp 18 | Ht 72.0 in | Wt 143.1 lb

## 2017-02-03 DIAGNOSIS — C3411 Malignant neoplasm of upper lobe, right bronchus or lung: Secondary | ICD-10-CM

## 2017-02-03 DIAGNOSIS — Z87891 Personal history of nicotine dependence: Secondary | ICD-10-CM | POA: Diagnosis not present

## 2017-02-03 DIAGNOSIS — C3491 Malignant neoplasm of unspecified part of right bronchus or lung: Secondary | ICD-10-CM

## 2017-02-03 DIAGNOSIS — G4730 Sleep apnea, unspecified: Secondary | ICD-10-CM | POA: Diagnosis not present

## 2017-02-03 DIAGNOSIS — Z5111 Encounter for antineoplastic chemotherapy: Secondary | ICD-10-CM

## 2017-02-03 DIAGNOSIS — C3431 Malignant neoplasm of lower lobe, right bronchus or lung: Secondary | ICD-10-CM

## 2017-02-03 DIAGNOSIS — J449 Chronic obstructive pulmonary disease, unspecified: Secondary | ICD-10-CM | POA: Diagnosis not present

## 2017-02-03 DIAGNOSIS — Z79899 Other long term (current) drug therapy: Secondary | ICD-10-CM | POA: Diagnosis not present

## 2017-02-03 DIAGNOSIS — Z510 Encounter for antineoplastic radiation therapy: Secondary | ICD-10-CM | POA: Diagnosis not present

## 2017-02-03 LAB — CBC WITH DIFFERENTIAL/PLATELET
BASO%: 0.5 % (ref 0.0–2.0)
BASOS ABS: 0 10*3/uL (ref 0.0–0.1)
EOS ABS: 0.1 10*3/uL (ref 0.0–0.5)
EOS%: 1.3 % (ref 0.0–7.0)
HCT: 35.6 % — ABNORMAL LOW (ref 38.4–49.9)
HEMOGLOBIN: 12.1 g/dL — AB (ref 13.0–17.1)
LYMPH%: 9.8 % — ABNORMAL LOW (ref 14.0–49.0)
MCH: 30.6 pg (ref 27.2–33.4)
MCHC: 34 g/dL (ref 32.0–36.0)
MCV: 89.9 fL (ref 79.3–98.0)
MONO#: 0.2 10*3/uL (ref 0.1–0.9)
MONO%: 5.6 % (ref 0.0–14.0)
NEUT%: 82.8 % — ABNORMAL HIGH (ref 39.0–75.0)
NEUTROS ABS: 3.3 10*3/uL (ref 1.5–6.5)
NRBC: 0 % (ref 0–0)
Platelets: 136 10*3/uL — ABNORMAL LOW (ref 140–400)
RBC: 3.96 10*6/uL — AB (ref 4.20–5.82)
RDW: 13.8 % (ref 11.0–14.6)
WBC: 4 10*3/uL (ref 4.0–10.3)
lymph#: 0.4 10*3/uL — ABNORMAL LOW (ref 0.9–3.3)

## 2017-02-03 LAB — COMPREHENSIVE METABOLIC PANEL
ALBUMIN: 3.3 g/dL — AB (ref 3.5–5.0)
ALK PHOS: 83 U/L (ref 40–150)
ALT: 7 U/L (ref 0–55)
AST: 14 U/L (ref 5–34)
Anion Gap: 8 mEq/L (ref 3–11)
BILIRUBIN TOTAL: 0.5 mg/dL (ref 0.20–1.20)
BUN: 13.2 mg/dL (ref 7.0–26.0)
CO2: 24 meq/L (ref 22–29)
CREATININE: 0.8 mg/dL (ref 0.7–1.3)
Calcium: 9.4 mg/dL (ref 8.4–10.4)
Chloride: 102 mEq/L (ref 98–109)
EGFR: >90 mL/min/{1.73_m2} (ref >90)
GLUCOSE: 102 mg/dL (ref 70–140)
Potassium: 4.1 mEq/L (ref 3.5–5.1)
SODIUM: 135 meq/L — AB (ref 136–145)
TOTAL PROTEIN: 7.8 g/dL (ref 6.4–8.3)

## 2017-02-03 LAB — GUARDANT 360

## 2017-02-03 MED ORDER — FAMOTIDINE IN NACL 20-0.9 MG/50ML-% IV SOLN
INTRAVENOUS | Status: AC
  Filled 2017-02-03: qty 50

## 2017-02-03 MED ORDER — DIPHENHYDRAMINE HCL 50 MG/ML IJ SOLN
INTRAMUSCULAR | Status: AC
  Filled 2017-02-03: qty 1

## 2017-02-03 MED ORDER — SODIUM CHLORIDE 0.9 % IV SOLN
161.2000 mg | Freq: Once | INTRAVENOUS | Status: AC
  Administered 2017-02-03: 160 mg via INTRAVENOUS
  Filled 2017-02-03: qty 16

## 2017-02-03 MED ORDER — FAMOTIDINE IN NACL 20-0.9 MG/50ML-% IV SOLN
20.0000 mg | Freq: Once | INTRAVENOUS | Status: AC
  Administered 2017-02-03: 20 mg via INTRAVENOUS

## 2017-02-03 MED ORDER — PALONOSETRON HCL INJECTION 0.25 MG/5ML
INTRAVENOUS | Status: AC
  Filled 2017-02-03: qty 5

## 2017-02-03 MED ORDER — SODIUM CHLORIDE 0.9 % IV SOLN: 10.0000 mg | Freq: Once | INTRAVENOUS | Status: DC

## 2017-02-03 MED ORDER — DEXAMETHASONE SODIUM PHOSPHATE 10 MG/ML IJ SOLN
10.0000 mg | Freq: Once | INTRAMUSCULAR | Status: AC
  Administered 2017-02-03: 10 mg via INTRAVENOUS

## 2017-02-03 MED ORDER — DEXTROSE 5 % IV SOLN
45.0000 mg/m2 | Freq: Once | INTRAVENOUS | Status: AC
  Administered 2017-02-03: 78 mg via INTRAVENOUS
  Filled 2017-02-03: qty 13

## 2017-02-03 MED ORDER — DIPHENHYDRAMINE HCL 50 MG/ML IJ SOLN
25.0000 mg | Freq: Once | INTRAMUSCULAR | Status: AC
  Administered 2017-02-03: 25 mg via INTRAVENOUS

## 2017-02-03 MED ORDER — DEXAMETHASONE SODIUM PHOSPHATE 10 MG/ML IJ SOLN
INTRAMUSCULAR | Status: AC
  Filled 2017-02-03: qty 1

## 2017-02-03 MED ORDER — PALONOSETRON HCL INJECTION 0.25 MG/5ML
0.2500 mg | Freq: Once | INTRAVENOUS | Status: AC
  Administered 2017-02-03: 0.25 mg via INTRAVENOUS

## 2017-02-03 MED ORDER — SODIUM CHLORIDE 0.9 % IV SOLN
Freq: Once | INTRAVENOUS | Status: AC
  Administered 2017-02-03: 15:00:00 via INTRAVENOUS

## 2017-02-03 NOTE — Progress Notes (Signed)
[facility] Follow up:    Harry Noon, MD [place] [address]   DIAGNOSIS: Stage IIIA (T4, N1, M0) non-small cell lung cancer, favoring adenocarcinoma presented with large right lower lobe lung mass in addition to right hilar lymphadenopathy and right upper lobe apical pulmonary nodule diagnosed in July 2018.  SUMMARY OF ONCOLOGIC HISTORY:  No history exists.    CURRENT THERAPY: Concurrent chemoradiation with weekly carboplatin for AUC of 2 and paclitaxel 45 MG/M2. First dose 12/30/2016. Status post 3 cycles.  INTERVAL HISTORY: Harry Allen 76 y.o. male returns for routine follow-up with his wife. The patient is feeling fine today with no specific complaints except for persistent cough is nonproductive. He is no longer having blood tinged sputum. The patient continues to tolerate his chemotherapy well. Benadryl dose was reduced with the last cycle of chemotherapy and his dizziness was much better. The patient's wife states that he not sleeping very well the night of chemotherapy and inquires if his dexamethasone dose can be reduced as well. He denied having any fevers or chills. He denies chest pain and shortness of breath. Denies abdominal pain, nausea, vomiting. Denies difficulty swallowing. Denies constipation and diarrhea. Denies neuropathy. The patient is here for evaluation prior to cycle 6 of his chemotherapy.   Patient Active Problem List   Diagnosis Date Noted  . COPD (chronic obstructive pulmonary disease) ([place]) 01/17/2017  . Obstructive sleep apnea 01/17/2017  . Encounter for antineoplastic chemotherapy 12/21/2016  . Goals of care, counseling/discussion 12/21/2016  . Adenocarcinoma of right lung, stage 3 ([place]) 12/19/2016  . Lung mass 11/11/2016  . SACROILIAC JOINT DYSFUNCTION 07/14/2007    is allergic to no known allergies.  MEDICAL HISTORY: Past Medical History:  Diagnosis Date  . Adenocarcinoma of right lung, stage 3 ([place])  12/19/2016  . Cancer ([place])    lung cancer  . COPD (chronic obstructive pulmonary disease) ([place])   . H/O hernia repair   . Sleep apnea     SURGICAL HISTORY: Past Surgical History:  Procedure Laterality Date  . APPENDECTOMY    . HERNIA REPAIR    . HYDROCELE EXCISION     on neck; 1968  . VIDEO BRONCHOSCOPY WITH ENDOBRONCHIAL ULTRASOUND N/A 12/09/2016   Procedure: VIDEO BRONCHOSCOPY WITH ENDOBRONCHIAL ULTRASOUND;  Surgeon: [name], MD;  Location: [facility];  Service: Thoracic;  Laterality: N/A;    SOCIAL HISTORY: Social History   Social History  . Marital status: Married    Spouse name: N/A  . Number of children: N/A  . Years of education: N/A   Occupational History  . Not on file.   Social History Main Topics  . Smoking status: Former Smoker    Packs/day: 1.00    Years: 8.00    Quit date: 11/19/2013  . Smokeless tobacco: Never Used  . Alcohol use No  . Drug use: No  . Sexual activity: No   Other Topics Concern  . Not on file   Social History Narrative  . No narrative on file    FAMILY HISTORY: Family History  Problem Relation Age of Onset  . Cancer Father        pancreatic    Review of Systems  Constitutional: Negative.   HENT:  Negative.   Eyes: Negative.   Respiratory: Positive for cough. Negative for hemoptysis, shortness of breath and wheezing.   Cardiovascular: Negative.   Gastrointestinal: Negative.   Genitourinary: Negative.    Musculoskeletal: Negative.   Skin:  Negative.   Neurological: Negative.   Hematological: Negative.   Psychiatric/Behavioral: Negative.       PHYSICAL EXAMINATION  ECOG PERFORMANCE STATUS: 1 - Symptomatic but completely ambulatory  Vitals:   02/03/17 1331  BP: (!) 102/58  Pulse: 65  Resp: 18  SpO2: 96%    Physical Exam  Constitutional: He is oriented to person, place, and time and well-developed, well-nourished, and in no distress. No distress.  HENT:  Head: Normocephalic.  Mouth/Throat: Oropharynx  is clear and moist. No oropharyngeal exudate.  Eyes: Conjunctivae are normal. Right eye exhibits no discharge. Left eye exhibits no discharge. No scleral icterus.  Neck: Normal range of motion. Neck supple.  Cardiovascular: Normal rate, regular rhythm, normal heart sounds and intact distal pulses.   Pulmonary/Chest: Effort normal and breath sounds normal. No respiratory distress. He has no wheezes. He has no rales.  Abdominal: Soft. Bowel sounds are normal. He exhibits no distension and no mass. There is no tenderness.  Musculoskeletal: Normal range of motion. He exhibits no edema.  Lymphadenopathy:    He has no cervical adenopathy.  Neurological: He is alert and oriented to person, place, and time. He exhibits normal muscle tone. Gait normal.  Skin: Skin is warm and dry. No rash noted. He is not diaphoretic. No erythema. No pallor.  Psychiatric: Mood, memory, affect and judgment normal.  Vitals reviewed.   LABORATORY DATA:  CBC    Component Value Date/Time   WBC 4.0 02/03/2017 1307   WBC 10.4 12/04/2016 1553   RBC 3.96 (L) 02/03/2017 1307   RBC 4.42 12/04/2016 1553   HGB 12.1 (L) 02/03/2017 1307   HCT 35.6 (L) 02/03/2017 1307   PLT 136 (L) 02/03/2017 1307   MCV 89.9 02/03/2017 1307   MCH 30.6 02/03/2017 1307   MCH 29.6 12/04/2016 1553   MCHC 34.0 02/03/2017 1307   MCHC 33.7 12/04/2016 1553   RDW 13.8 02/03/2017 1307   LYMPHSABS 0.4 (L) 02/03/2017 1307   MONOABS 0.2 02/03/2017 1307   EOSABS 0.1 02/03/2017 1307   BASOSABS 0.0 02/03/2017 1307    CMP     Component Value Date/Time   NA 135 (L) 02/03/2017 1308   K 4.1 02/03/2017 1308   CL 102 12/04/2016 1553   CO2 24 02/03/2017 1308   GLUCOSE 102 02/03/2017 1308   BUN 13.2 02/03/2017 1308   CREATININE 0.8 02/03/2017 1308   CALCIUM 9.4 02/03/2017 1308   PROT 7.8 02/03/2017 1308   ALBUMIN 3.3 (L) 02/03/2017 1308   AST 14 02/03/2017 1308   ALT 7 02/03/2017 1308   ALKPHOS 83 02/03/2017 1308   BILITOT 0.50 02/03/2017 1308    GFRNONAA >60 12/04/2016 1553   GFRAA >60 12/04/2016 1553    RADIOGRAPHIC STUDIES:  No results found.  ASSESSMENT and THERAPY PLAN:   Adenocarcinoma of right lung, stage 3 ([place]) This is a very pleasant 76 year old African-American male recently diagnosed with a stage IIIA(T4, N1, M0) non-small cell lung cancer, favoring adenocarcinoma diagnosed in July 2018. The patient is currently undergoing a course of concurrent chemoradiation with weekly carboplatin for AUC of 2 and paclitaxel 45 MG/M2. Guardant 360 shows no actionable mutations.  The patient is tolerating his chemotherapy well. Recommend that he proceed with cycle 6 today as scheduled. Benadryl dose will continue at 25 mg and will reduce the dexamethasone dose from 20 mg down to 10 mg.  He will follow-up next week for labs and chemotherapy and in 2 weeks for labs and a visit. A  restaging CT scan will be ordered at that visit.  Plan reviewed with Dr. Julien Nordmann.   No orders of the defined types were placed in this encounter.   All questions were answered. The patient knows to call the clinic with any problems, questions or concerns. We can certainly see the patient much sooner if necessary.  Mikey Bussing, NP 02/04/2017

## 2017-02-03 NOTE — Patient Instructions (Signed)
[facility] Discharge Instructions for Patients Receiving Chemotherapy  Today you received the following chemotherapy agents Herceptin and Taxol  To help prevent nausea and vomiting after your treatment, we encourage you to take your nausea medication as directed but no Zofran for 3 days  If you develop nausea and vomiting that is not controlled by your nausea medication, call the clinic.   BELOW ARE SYMPTOMS THAT SHOULD BE REPORTED IMMEDIATELY:  *FEVER GREATER THAN 100.5 F  *CHILLS WITH OR WITHOUT FEVER  NAUSEA AND VOMITING THAT IS NOT CONTROLLED WITH YOUR NAUSEA MEDICATION  *UNUSUAL SHORTNESS OF BREATH  *UNUSUAL BRUISING OR BLEEDING  TENDERNESS IN MOUTH AND THROAT WITH OR WITHOUT PRESENCE OF ULCERS  *URINARY PROBLEMS  *BOWEL PROBLEMS  UNUSUAL RASH Items with * indicate a potential emergency and should be followed up as soon as possible.  Feel free to call the clinic you have any questions or concerns. The clinic phone number is [phone].  Please show the [facility] at check-in to the Emergency Department and triage nurse.

## 2017-02-03 NOTE — Telephone Encounter (Signed)
Gave avs and calendar for October  °

## 2017-02-04 ENCOUNTER — Ambulatory Visit
Admission: RE | Admit: 2017-02-04 | Discharge: 2017-02-04 | Disposition: A | Discharge status: Home / Self Care | Payer: Medicare Other | Source: Ambulatory Visit | Attending: Radiation Oncology | Admitting: Radiation Oncology

## 2017-02-04 ENCOUNTER — Ambulatory Visit: Payer: Medicare Other

## 2017-02-04 DIAGNOSIS — C3411 Malignant neoplasm of upper lobe, right bronchus or lung: Secondary | ICD-10-CM | POA: Diagnosis not present

## 2017-02-04 DIAGNOSIS — Z79899 Other long term (current) drug therapy: Secondary | ICD-10-CM | POA: Diagnosis not present

## 2017-02-04 DIAGNOSIS — Z510 Encounter for antineoplastic radiation therapy: Secondary | ICD-10-CM | POA: Diagnosis not present

## 2017-02-04 DIAGNOSIS — C3431 Malignant neoplasm of lower lobe, right bronchus or lung: Secondary | ICD-10-CM | POA: Diagnosis not present

## 2017-02-04 DIAGNOSIS — J449 Chronic obstructive pulmonary disease, unspecified: Secondary | ICD-10-CM | POA: Diagnosis not present

## 2017-02-04 DIAGNOSIS — G4730 Sleep apnea, unspecified: Secondary | ICD-10-CM | POA: Diagnosis not present

## 2017-02-04 DIAGNOSIS — Z87891 Personal history of nicotine dependence: Secondary | ICD-10-CM | POA: Diagnosis not present

## 2017-02-04 NOTE — Assessment & Plan Note (Signed)
This is a very pleasant 76 year old African-American male recently diagnosed with a stage IIIA(T4, N1, M0) non-small cell lung cancer, favoring adenocarcinoma diagnosed in July 2018. The patient is currently undergoing a course of concurrent chemoradiation with weekly carboplatin for AUC of 2 and paclitaxel 45 MG/M2. Guardant 360 shows no actionable mutations.  The patient is tolerating his chemotherapy well. Recommend that he proceed with cycle 6 today as scheduled. Benadryl dose will continue at 25 mg and will reduce the dexamethasone dose from 20 mg down to 10 mg.  He will follow-up next week for labs and chemotherapy and in 2 weeks for labs and a visit. A restaging CT scan will be ordered at that visit.  Plan reviewed with Dr. Julien Nordmann.

## 2017-02-05 ENCOUNTER — Ambulatory Visit: Payer: Medicare Other

## 2017-02-05 ENCOUNTER — Ambulatory Visit
Admission: RE | Admit: 2017-02-05 | Discharge: 2017-02-05 | Disposition: A | Discharge status: Home / Self Care | Payer: Medicare Other | Source: Ambulatory Visit | Attending: Radiation Oncology | Admitting: Radiation Oncology

## 2017-02-05 DIAGNOSIS — J449 Chronic obstructive pulmonary disease, unspecified: Secondary | ICD-10-CM | POA: Diagnosis not present

## 2017-02-05 DIAGNOSIS — Z87891 Personal history of nicotine dependence: Secondary | ICD-10-CM | POA: Diagnosis not present

## 2017-02-05 DIAGNOSIS — Z79899 Other long term (current) drug therapy: Secondary | ICD-10-CM | POA: Diagnosis not present

## 2017-02-05 DIAGNOSIS — C3411 Malignant neoplasm of upper lobe, right bronchus or lung: Secondary | ICD-10-CM | POA: Diagnosis not present

## 2017-02-05 DIAGNOSIS — C3431 Malignant neoplasm of lower lobe, right bronchus or lung: Secondary | ICD-10-CM | POA: Diagnosis not present

## 2017-02-05 DIAGNOSIS — G4730 Sleep apnea, unspecified: Secondary | ICD-10-CM | POA: Diagnosis not present

## 2017-02-05 DIAGNOSIS — Z510 Encounter for antineoplastic radiation therapy: Secondary | ICD-10-CM | POA: Diagnosis not present

## 2017-02-06 ENCOUNTER — Ambulatory Visit
Admission: RE | Admit: 2017-02-06 | Discharge: 2017-02-06 | Disposition: A | Discharge status: Home / Self Care | Payer: Medicare Other | Source: Ambulatory Visit | Attending: Radiation Oncology | Admitting: Radiation Oncology

## 2017-02-06 ENCOUNTER — Ambulatory Visit: Payer: Medicare Other

## 2017-02-06 DIAGNOSIS — J449 Chronic obstructive pulmonary disease, unspecified: Secondary | ICD-10-CM | POA: Diagnosis not present

## 2017-02-06 DIAGNOSIS — Z79899 Other long term (current) drug therapy: Secondary | ICD-10-CM | POA: Diagnosis not present

## 2017-02-06 DIAGNOSIS — C3411 Malignant neoplasm of upper lobe, right bronchus or lung: Secondary | ICD-10-CM | POA: Diagnosis not present

## 2017-02-06 DIAGNOSIS — C3431 Malignant neoplasm of lower lobe, right bronchus or lung: Secondary | ICD-10-CM | POA: Diagnosis not present

## 2017-02-06 DIAGNOSIS — Z87891 Personal history of nicotine dependence: Secondary | ICD-10-CM | POA: Diagnosis not present

## 2017-02-06 DIAGNOSIS — Z510 Encounter for antineoplastic radiation therapy: Secondary | ICD-10-CM | POA: Diagnosis not present

## 2017-02-06 DIAGNOSIS — G4730 Sleep apnea, unspecified: Secondary | ICD-10-CM | POA: Diagnosis not present

## 2017-02-07 ENCOUNTER — Ambulatory Visit
Admission: RE | Admit: 2017-02-07 | Discharge: 2017-02-07 | Disposition: A | Discharge status: Home / Self Care | Payer: Medicare Other | Source: Ambulatory Visit | Attending: Radiation Oncology | Admitting: Radiation Oncology

## 2017-02-07 DIAGNOSIS — C3431 Malignant neoplasm of lower lobe, right bronchus or lung: Secondary | ICD-10-CM | POA: Diagnosis not present

## 2017-02-07 DIAGNOSIS — C3411 Malignant neoplasm of upper lobe, right bronchus or lung: Secondary | ICD-10-CM | POA: Diagnosis not present

## 2017-02-07 DIAGNOSIS — J449 Chronic obstructive pulmonary disease, unspecified: Secondary | ICD-10-CM | POA: Diagnosis not present

## 2017-02-07 DIAGNOSIS — Z510 Encounter for antineoplastic radiation therapy: Secondary | ICD-10-CM | POA: Diagnosis not present

## 2017-02-07 DIAGNOSIS — Z79899 Other long term (current) drug therapy: Secondary | ICD-10-CM | POA: Diagnosis not present

## 2017-02-07 DIAGNOSIS — Z87891 Personal history of nicotine dependence: Secondary | ICD-10-CM | POA: Diagnosis not present

## 2017-02-07 DIAGNOSIS — G4730 Sleep apnea, unspecified: Secondary | ICD-10-CM | POA: Diagnosis not present

## 2017-02-10 ENCOUNTER — Ambulatory Visit: Payer: Medicare Other

## 2017-02-10 ENCOUNTER — Other Ambulatory Visit (HOSPITAL_BASED_OUTPATIENT_CLINIC_OR_DEPARTMENT_OTHER): Payer: Medicare Other

## 2017-02-10 ENCOUNTER — Ambulatory Visit (HOSPITAL_BASED_OUTPATIENT_CLINIC_OR_DEPARTMENT_OTHER): Payer: Medicare Other

## 2017-02-10 ENCOUNTER — Ambulatory Visit
Admission: RE | Admit: 2017-02-10 | Discharge: 2017-02-10 | Disposition: A | Discharge status: Home / Self Care | Payer: Medicare Other | Source: Ambulatory Visit | Attending: Radiation Oncology | Admitting: Radiation Oncology

## 2017-02-10 ENCOUNTER — Other Ambulatory Visit: Payer: Medicare Other

## 2017-02-10 VITALS — BP 110/65 | HR 64 | Resp 17

## 2017-02-10 DIAGNOSIS — C3431 Malignant neoplasm of lower lobe, right bronchus or lung: Secondary | ICD-10-CM | POA: Diagnosis present

## 2017-02-10 DIAGNOSIS — J449 Chronic obstructive pulmonary disease, unspecified: Secondary | ICD-10-CM | POA: Diagnosis not present

## 2017-02-10 DIAGNOSIS — Z5111 Encounter for antineoplastic chemotherapy: Secondary | ICD-10-CM | POA: Diagnosis present

## 2017-02-10 DIAGNOSIS — Z79899 Other long term (current) drug therapy: Secondary | ICD-10-CM | POA: Diagnosis not present

## 2017-02-10 DIAGNOSIS — C3491 Malignant neoplasm of unspecified part of right bronchus or lung: Secondary | ICD-10-CM

## 2017-02-10 DIAGNOSIS — G4730 Sleep apnea, unspecified: Secondary | ICD-10-CM | POA: Diagnosis not present

## 2017-02-10 DIAGNOSIS — Z510 Encounter for antineoplastic radiation therapy: Secondary | ICD-10-CM | POA: Diagnosis not present

## 2017-02-10 DIAGNOSIS — Z87891 Personal history of nicotine dependence: Secondary | ICD-10-CM | POA: Diagnosis not present

## 2017-02-10 DIAGNOSIS — C3411 Malignant neoplasm of upper lobe, right bronchus or lung: Secondary | ICD-10-CM | POA: Diagnosis not present

## 2017-02-10 LAB — CBC WITH DIFFERENTIAL/PLATELET
BASO%: 0.4 % (ref 0.0–2.0)
BASOS ABS: 0 10*3/uL (ref 0.0–0.1)
EOS%: 1.1 % (ref 0.0–7.0)
Eosinophils Absolute: 0 10*3/uL (ref 0.0–0.5)
HCT: 36.8 % — ABNORMAL LOW (ref 38.4–49.9)
HGB: 12.4 g/dL — ABNORMAL LOW (ref 13.0–17.1)
LYMPH%: 16 % (ref 14.0–49.0)
MCH: 30.5 pg (ref 27.2–33.4)
MCHC: 33.7 g/dL (ref 32.0–36.0)
MCV: 90.4 fL (ref 79.3–98.0)
MONO#: 0.2 10*3/uL (ref 0.1–0.9)
MONO%: 5.9 % (ref 0.0–14.0)
NEUT#: 2.1 10*3/uL (ref 1.5–6.5)
NEUT%: 76.6 % — AB (ref 39.0–75.0)
Platelets: 177 10*3/uL (ref 140–400)
RBC: 4.07 10*6/uL — AB (ref 4.20–5.82)
RDW: 14.3 % (ref 11.0–14.6)
WBC: 2.7 10*3/uL — ABNORMAL LOW (ref 4.0–10.3)
lymph#: 0.4 10*3/uL — ABNORMAL LOW (ref 0.9–3.3)
nRBC: 0 % (ref 0–0)

## 2017-02-10 LAB — COMPREHENSIVE METABOLIC PANEL
ALK PHOS: 89 U/L (ref 40–150)
ALT: 6 U/L (ref 0–55)
AST: 16 U/L (ref 5–34)
Albumin: 3.4 g/dL — ABNORMAL LOW (ref 3.5–5.0)
Anion Gap: 8 mEq/L (ref 3–11)
BUN: 13.2 mg/dL (ref 7.0–26.0)
CHLORIDE: 103 meq/L (ref 98–109)
CO2: 24 meq/L (ref 22–29)
Calcium: 9.3 mg/dL (ref 8.4–10.4)
Creatinine: 0.7 mg/dL (ref 0.7–1.3)
GLUCOSE: 84 mg/dL (ref 70–140)
POTASSIUM: 4.3 meq/L (ref 3.5–5.1)
SODIUM: 135 meq/L — AB (ref 136–145)
Total Bilirubin: 0.43 mg/dL (ref 0.20–1.20)
Total Protein: 8.1 g/dL (ref 6.4–8.3)

## 2017-02-10 MED ORDER — PACLITAXEL CHEMO INJECTION 300 MG/50ML
45.0000 mg/m2 | Freq: Once | INTRAVENOUS | Status: AC
  Administered 2017-02-10: 78 mg via INTRAVENOUS
  Filled 2017-02-10: qty 13

## 2017-02-10 MED ORDER — DIPHENHYDRAMINE HCL 50 MG/ML IJ SOLN
INTRAMUSCULAR | Status: AC
  Filled 2017-02-10: qty 1

## 2017-02-10 MED ORDER — SODIUM CHLORIDE 0.9 % IV SOLN
161.2000 mg | Freq: Once | INTRAVENOUS | Status: AC
  Administered 2017-02-10: 160 mg via INTRAVENOUS
  Filled 2017-02-10: qty 16

## 2017-02-10 MED ORDER — PALONOSETRON HCL INJECTION 0.25 MG/5ML
INTRAVENOUS | Status: AC
  Filled 2017-02-10: qty 5

## 2017-02-10 MED ORDER — DEXAMETHASONE SODIUM PHOSPHATE 10 MG/ML IJ SOLN
INTRAMUSCULAR | Status: AC
Start: 2017-02-10 — End: ?
  Filled 2017-02-10: qty 1

## 2017-02-10 MED ORDER — DEXAMETHASONE SODIUM PHOSPHATE 10 MG/ML IJ SOLN
10.0000 mg | Freq: Once | INTRAMUSCULAR | Status: AC
  Administered 2017-02-10: 10 mg via INTRAVENOUS

## 2017-02-10 MED ORDER — PALONOSETRON HCL INJECTION 0.25 MG/5ML
0.2500 mg | Freq: Once | INTRAVENOUS | Status: AC
  Administered 2017-02-10: 0.25 mg via INTRAVENOUS

## 2017-02-10 MED ORDER — DIPHENHYDRAMINE HCL 50 MG/ML IJ SOLN
25.0000 mg | Freq: Once | INTRAMUSCULAR | Status: AC
  Administered 2017-02-10: 25 mg via INTRAVENOUS

## 2017-02-10 MED ORDER — SODIUM CHLORIDE 0.9 % IV SOLN
10.0000 mg | Freq: Once | INTRAVENOUS | Status: DC
  Filled 2017-02-10: qty 1

## 2017-02-10 MED ORDER — SODIUM CHLORIDE 0.9 % IV SOLN
Freq: Once | INTRAVENOUS | Status: AC
  Administered 2017-02-10: 14:00:00 via INTRAVENOUS

## 2017-02-10 MED ORDER — FAMOTIDINE IN NACL 20-0.9 MG/50ML-% IV SOLN
INTRAVENOUS | Status: AC
Start: 2017-02-10 — End: ?
  Filled 2017-02-10: qty 50

## 2017-02-10 MED ORDER — FAMOTIDINE IN NACL 20-0.9 MG/50ML-% IV SOLN
20.0000 mg | Freq: Once | INTRAVENOUS | Status: AC
  Administered 2017-02-10: 20 mg via INTRAVENOUS

## 2017-02-10 NOTE — Patient Instructions (Signed)
[facility] Discharge Instructions for Patients Receiving Chemotherapy  Today you received the following chemotherapy agents Carboplatin, Taxol.   To help prevent nausea and vomiting after your treatment, we encourage you to take your nausea medication as prescribed. NO ZOFRAN FOR 3 DAYS.    If you develop nausea and vomiting that is not controlled by your nausea medication, call the clinic.   BELOW ARE SYMPTOMS THAT SHOULD BE REPORTED IMMEDIATELY:  *FEVER GREATER THAN 100.5 F  *CHILLS WITH OR WITHOUT FEVER  NAUSEA AND VOMITING THAT IS NOT CONTROLLED WITH YOUR NAUSEA MEDICATION  *UNUSUAL SHORTNESS OF BREATH  *UNUSUAL BRUISING OR BLEEDING  TENDERNESS IN MOUTH AND THROAT WITH OR WITHOUT PRESENCE OF ULCERS  *URINARY PROBLEMS  *BOWEL PROBLEMS  UNUSUAL RASH Items with * indicate a potential emergency and should be followed up as soon as possible.  Feel free to call the clinic you have any questions or concerns. The clinic phone number is [phone].  Please show the [facility] at check-in to the Emergency Department and triage nurse.

## 2017-02-11 ENCOUNTER — Ambulatory Visit
Admission: RE | Admit: 2017-02-11 | Discharge: 2017-02-11 | Disposition: A | Discharge status: Home / Self Care | Payer: Medicare Other | Source: Ambulatory Visit | Attending: Radiation Oncology | Admitting: Radiation Oncology

## 2017-02-11 DIAGNOSIS — Z510 Encounter for antineoplastic radiation therapy: Secondary | ICD-10-CM | POA: Diagnosis not present

## 2017-02-11 DIAGNOSIS — C3411 Malignant neoplasm of upper lobe, right bronchus or lung: Secondary | ICD-10-CM | POA: Diagnosis not present

## 2017-02-11 DIAGNOSIS — J449 Chronic obstructive pulmonary disease, unspecified: Secondary | ICD-10-CM | POA: Diagnosis not present

## 2017-02-11 DIAGNOSIS — Z87891 Personal history of nicotine dependence: Secondary | ICD-10-CM | POA: Diagnosis not present

## 2017-02-11 DIAGNOSIS — Z79899 Other long term (current) drug therapy: Secondary | ICD-10-CM | POA: Diagnosis not present

## 2017-02-11 DIAGNOSIS — C3431 Malignant neoplasm of lower lobe, right bronchus or lung: Secondary | ICD-10-CM | POA: Diagnosis not present

## 2017-02-11 DIAGNOSIS — G4730 Sleep apnea, unspecified: Secondary | ICD-10-CM | POA: Diagnosis not present

## 2017-02-12 ENCOUNTER — Ambulatory Visit
Admission: RE | Admit: 2017-02-12 | Discharge: 2017-02-12 | Disposition: A | Discharge status: Home / Self Care | Payer: Medicare Other | Source: Ambulatory Visit | Attending: Radiation Oncology | Admitting: Radiation Oncology

## 2017-02-12 ENCOUNTER — Telehealth: Payer: Self-pay | Admitting: Internal Medicine

## 2017-02-12 DIAGNOSIS — C3431 Malignant neoplasm of lower lobe, right bronchus or lung: Secondary | ICD-10-CM | POA: Diagnosis not present

## 2017-02-12 DIAGNOSIS — Z87891 Personal history of nicotine dependence: Secondary | ICD-10-CM | POA: Diagnosis not present

## 2017-02-12 DIAGNOSIS — G4730 Sleep apnea, unspecified: Secondary | ICD-10-CM | POA: Diagnosis not present

## 2017-02-12 DIAGNOSIS — C3411 Malignant neoplasm of upper lobe, right bronchus or lung: Secondary | ICD-10-CM | POA: Diagnosis not present

## 2017-02-12 DIAGNOSIS — J449 Chronic obstructive pulmonary disease, unspecified: Secondary | ICD-10-CM | POA: Diagnosis not present

## 2017-02-12 DIAGNOSIS — Z510 Encounter for antineoplastic radiation therapy: Secondary | ICD-10-CM | POA: Diagnosis not present

## 2017-02-12 DIAGNOSIS — Z79899 Other long term (current) drug therapy: Secondary | ICD-10-CM | POA: Diagnosis not present

## 2017-02-12 NOTE — Telephone Encounter (Signed)
Harry Allen called in for patient need to reschedule 10/1 to 10/8

## 2017-02-13 ENCOUNTER — Ambulatory Visit
Admission: RE | Admit: 2017-02-13 | Discharge: 2017-02-13 | Disposition: A | Discharge status: Home / Self Care | Payer: Medicare Other | Source: Ambulatory Visit | Attending: Radiation Oncology | Admitting: Radiation Oncology

## 2017-02-13 ENCOUNTER — Ambulatory Visit: Payer: Medicare Other

## 2017-02-13 DIAGNOSIS — J449 Chronic obstructive pulmonary disease, unspecified: Secondary | ICD-10-CM | POA: Diagnosis not present

## 2017-02-13 DIAGNOSIS — G4730 Sleep apnea, unspecified: Secondary | ICD-10-CM | POA: Diagnosis not present

## 2017-02-13 DIAGNOSIS — Z87891 Personal history of nicotine dependence: Secondary | ICD-10-CM | POA: Diagnosis not present

## 2017-02-13 DIAGNOSIS — C3431 Malignant neoplasm of lower lobe, right bronchus or lung: Secondary | ICD-10-CM | POA: Diagnosis not present

## 2017-02-13 DIAGNOSIS — Z510 Encounter for antineoplastic radiation therapy: Secondary | ICD-10-CM | POA: Diagnosis not present

## 2017-02-13 DIAGNOSIS — C3411 Malignant neoplasm of upper lobe, right bronchus or lung: Secondary | ICD-10-CM | POA: Diagnosis not present

## 2017-02-13 DIAGNOSIS — Z79899 Other long term (current) drug therapy: Secondary | ICD-10-CM | POA: Diagnosis not present

## 2017-02-14 ENCOUNTER — Ambulatory Visit
Admission: RE | Admit: 2017-02-14 | Discharge: 2017-02-14 | Disposition: A | Discharge status: Home / Self Care | Payer: Medicare Other | Source: Ambulatory Visit | Attending: Radiation Oncology | Admitting: Radiation Oncology

## 2017-02-14 ENCOUNTER — Ambulatory Visit: Payer: Medicare Other

## 2017-02-14 DIAGNOSIS — Z87891 Personal history of nicotine dependence: Secondary | ICD-10-CM | POA: Diagnosis not present

## 2017-02-14 DIAGNOSIS — C3411 Malignant neoplasm of upper lobe, right bronchus or lung: Secondary | ICD-10-CM | POA: Diagnosis not present

## 2017-02-14 DIAGNOSIS — C3431 Malignant neoplasm of lower lobe, right bronchus or lung: Secondary | ICD-10-CM | POA: Diagnosis not present

## 2017-02-14 DIAGNOSIS — G4730 Sleep apnea, unspecified: Secondary | ICD-10-CM | POA: Diagnosis not present

## 2017-02-14 DIAGNOSIS — J449 Chronic obstructive pulmonary disease, unspecified: Secondary | ICD-10-CM | POA: Diagnosis not present

## 2017-02-14 DIAGNOSIS — Z79899 Other long term (current) drug therapy: Secondary | ICD-10-CM | POA: Diagnosis not present

## 2017-02-14 DIAGNOSIS — Z510 Encounter for antineoplastic radiation therapy: Secondary | ICD-10-CM | POA: Diagnosis not present

## 2017-02-17 ENCOUNTER — Ambulatory Visit: Payer: Medicare Other | Admitting: Internal Medicine

## 2017-02-17 ENCOUNTER — Ambulatory Visit: Payer: Medicare Other

## 2017-02-17 ENCOUNTER — Ambulatory Visit
Admission: RE | Admit: 2017-02-17 | Discharge: 2017-02-17 | Disposition: A | Discharge status: Home / Self Care | Payer: Medicare Other | Source: Ambulatory Visit | Attending: Radiation Oncology | Admitting: Radiation Oncology

## 2017-02-17 ENCOUNTER — Encounter: Payer: Self-pay | Admitting: Radiation Oncology

## 2017-02-17 ENCOUNTER — Other Ambulatory Visit: Payer: Medicare Other

## 2017-02-17 DIAGNOSIS — C3411 Malignant neoplasm of upper lobe, right bronchus or lung: Secondary | ICD-10-CM | POA: Diagnosis not present

## 2017-02-17 DIAGNOSIS — J449 Chronic obstructive pulmonary disease, unspecified: Secondary | ICD-10-CM | POA: Diagnosis not present

## 2017-02-17 DIAGNOSIS — Z79899 Other long term (current) drug therapy: Secondary | ICD-10-CM | POA: Diagnosis not present

## 2017-02-17 DIAGNOSIS — G4730 Sleep apnea, unspecified: Secondary | ICD-10-CM | POA: Diagnosis not present

## 2017-02-17 DIAGNOSIS — Z510 Encounter for antineoplastic radiation therapy: Secondary | ICD-10-CM | POA: Diagnosis not present

## 2017-02-17 DIAGNOSIS — C3431 Malignant neoplasm of lower lobe, right bronchus or lung: Secondary | ICD-10-CM | POA: Diagnosis not present

## 2017-02-17 DIAGNOSIS — Z87891 Personal history of nicotine dependence: Secondary | ICD-10-CM | POA: Diagnosis not present

## 2017-02-18 ENCOUNTER — Ambulatory Visit: Payer: Medicare Other

## 2017-02-19 NOTE — Progress Notes (Signed)
  Radiation Oncology         [phone] ________________________________  Name: Harry Allen MRN: [id number]  Date: 02/17/2017  DOB: 01/19/1941   End of Treatment Note  Diagnosis:   76 y.o. with Stage IIIA (T4, N1, M0) adenocarcinoma of the right lower lung     Indication for treatment:  Curative, Chemo-Radiotherapy       Radiation treatment dates:   12/31/2016 to 02/17/2017  Site/dose:   The primary tumor and involved mediastinal adenopathy were treated to 66 Gy in 33 fractions of 2 Gy.  Beams/energy:   A five field 3D conformal treatment arrangement was used delivering 6 and 10 MV photons.  Daily image-guidance CT was used to align the treatment with the targeted volume  Narrative: The patient tolerated radiation treatment relatively well.  The patient experienced some esophagitis characterized as frequency and intensity of productive cough with white sputum. Denies fatigue or skin changes within the treatment field.  Plan: The patient has completed radiation treatment. The patient will return to radiation oncology clinic for routine followup in one month. I advised him to call or return sooner if he has any questions or concerns related to his recovery or treatment.  ________________________________  Sheral Apley. Tammi Klippel, M.D.  This document serves as a record of services personally performed by Tyler Pita, MD. It was created on his behalf by Arlyce Harman, a trained medical scribe. The creation of this record is based on the scribe's personal observations and the provider's statements to them. This document has been checked and approved by the attending provider.

## 2017-02-20 ENCOUNTER — Telehealth: Payer: Self-pay | Admitting: Emergency Medicine

## 2017-02-20 MED ORDER — TIOTROPIUM BROMIDE MONOHYDRATE 2.5 MCG/ACT IN AERS: 2.0000 | INHALATION_SPRAY | Freq: Every day | RESPIRATORY_TRACT | 3 refills | Status: DC

## 2017-02-20 NOTE — Telephone Encounter (Signed)
Spoke with pt's wife and advised Rx would be sent into [facility]. Nothing further is needed.

## 2017-02-21 ENCOUNTER — Other Ambulatory Visit: Payer: Self-pay | Admitting: *Deleted

## 2017-02-21 DIAGNOSIS — C3491 Malignant neoplasm of unspecified part of right bronchus or lung: Secondary | ICD-10-CM

## 2017-02-24 ENCOUNTER — Encounter: Payer: Self-pay | Admitting: Internal Medicine

## 2017-02-24 ENCOUNTER — Telehealth: Payer: Self-pay | Admitting: Internal Medicine

## 2017-02-24 ENCOUNTER — Other Ambulatory Visit (HOSPITAL_BASED_OUTPATIENT_CLINIC_OR_DEPARTMENT_OTHER): Payer: Medicare Other

## 2017-02-24 ENCOUNTER — Ambulatory Visit (HOSPITAL_BASED_OUTPATIENT_CLINIC_OR_DEPARTMENT_OTHER): Payer: Medicare Other | Admitting: Internal Medicine

## 2017-02-24 VITALS — BP 113/66 | HR 80 | Temp 97.8°F | Resp 19 | Ht 72.0 in | Wt 144.2 lb

## 2017-02-24 DIAGNOSIS — C3431 Malignant neoplasm of lower lobe, right bronchus or lung: Secondary | ICD-10-CM

## 2017-02-24 DIAGNOSIS — C3491 Malignant neoplasm of unspecified part of right bronchus or lung: Secondary | ICD-10-CM

## 2017-02-24 DIAGNOSIS — Z5111 Encounter for antineoplastic chemotherapy: Secondary | ICD-10-CM

## 2017-02-24 DIAGNOSIS — Z23 Encounter for immunization: Secondary | ICD-10-CM | POA: Diagnosis not present

## 2017-02-24 DIAGNOSIS — J449 Chronic obstructive pulmonary disease, unspecified: Secondary | ICD-10-CM

## 2017-02-24 LAB — COMPREHENSIVE METABOLIC PANEL
ALT: 7 U/L (ref 0–55)
AST: 18 U/L (ref 5–34)
Albumin: 3.3 g/dL — ABNORMAL LOW (ref 3.5–5.0)
Alkaline Phosphatase: 86 U/L (ref 40–150)
Anion Gap: 6 mEq/L (ref 3–11)
BILIRUBIN TOTAL: 0.31 mg/dL (ref 0.20–1.20)
BUN: 11.1 mg/dL (ref 7.0–26.0)
CHLORIDE: 103 meq/L (ref 98–109)
CO2: 27 meq/L (ref 22–29)
CREATININE: 0.8 mg/dL (ref 0.7–1.3)
Calcium: 9.3 mg/dL (ref 8.4–10.4)
GLUCOSE: 133 mg/dL (ref 70–140)
Potassium: 3.7 mEq/L (ref 3.5–5.1)
SODIUM: 137 meq/L (ref 136–145)
TOTAL PROTEIN: 7.6 g/dL (ref 6.4–8.3)

## 2017-02-24 LAB — CBC WITH DIFFERENTIAL/PLATELET
BASO%: 0.5 % (ref 0.0–2.0)
Basophils Absolute: 0 10*3/uL (ref 0.0–0.1)
EOS%: 0.9 % (ref 0.0–7.0)
Eosinophils Absolute: 0 10*3/uL (ref 0.0–0.5)
HCT: 33.4 % — ABNORMAL LOW (ref 38.4–49.9)
HGB: 11.2 g/dL — ABNORMAL LOW (ref 13.0–17.1)
LYMPH%: 11.5 % — AB (ref 14.0–49.0)
MCH: 30.9 pg (ref 27.2–33.4)
MCHC: 33.5 g/dL (ref 32.0–36.0)
MCV: 92 fL (ref 79.3–98.0)
MONO#: 1 10*3/uL — ABNORMAL HIGH (ref 0.1–0.9)
MONO%: 22.6 % — AB (ref 0.0–14.0)
NEUT%: 64.5 % (ref 39.0–75.0)
NEUTROS ABS: 2.9 10*3/uL (ref 1.5–6.5)
Platelets: 218 10*3/uL (ref 140–400)
RBC: 3.63 10*6/uL — AB (ref 4.20–5.82)
RDW: 16.5 % — ABNORMAL HIGH (ref 11.0–14.6)
WBC: 4.4 10*3/uL (ref 4.0–10.3)
lymph#: 0.5 10*3/uL — ABNORMAL LOW (ref 0.9–3.3)

## 2017-02-24 NOTE — Progress Notes (Signed)
[facility] [phone]   [phone]  OFFICE PROGRESS NOTE  [name], MD [place] [address]  DIAGNOSIS: Stage IIIA (T4, N1, M0) non-small cell lung cancer, favoring adenocarcinoma presented with large right lower lobe lung mass in addition to right hilar lymphadenopathy and right upper lobe apical pulmonary nodule diagnosed in July 2018.  Guardant 360 testing: negative EGFR, ALK, ROS 1 and BRAF mutations.  PRIOR THERAPY: None  CURRENT THERAPY: Concurrent chemoradiation with weekly carboplatin for AUC of 2 and paclitaxel 45 MG/M2. First dose 12/30/2016.status post 7 cycles.  INTERVAL HISTORY: Harry Allen 76 y.o. male returns to the clinic today for follow-up visit accompanied by his wife. The patient is feeling fine today with no specific complaints. He tolerated the course of concurrent chemoradiation fairly well. He denied having any chest pain, shortness of breath, cough or hemoptysis. He denied having any dysphagia or odynophagia. He has no significant weight loss or night sweats. He has no nausea, vomiting, diarrhea or constipation. He is here today for evaluation  MEDICAL HISTORY: Past Medical History:  Diagnosis Date  . Adenocarcinoma of right lung, stage 3 ([place]) 12/19/2016  . Cancer ([place])    lung cancer  . COPD (chronic obstructive pulmonary disease) ([place])   . H/O hernia repair   . Sleep apnea     ALLERGIES:  is allergic to no known allergies.  MEDICATIONS:  Current Outpatient Prescriptions  Medication Sig Dispense Refill  . albuterol (PROVENTIL HFA;VENTOLIN HFA) 108 (90 Base) MCG/ACT inhaler Inhale 2 puffs into the lungs every 4 (four) hours as needed for wheezing or shortness of breath. 1 Inhaler 5  . hyaluronate sodium (RADIAPLEXRX) GEL Apply 1 application topically 2 (two) times daily.    . Tiotropium Bromide Monohydrate (SPIRIVA RESPIMAT) 2.5 MCG/ACT AERS Inhale 2 puffs into the lungs daily. 1  Inhaler 3   No current facility-administered medications for this visit.     SURGICAL HISTORY:  Past Surgical History:  Procedure Laterality Date  . APPENDECTOMY    . HERNIA REPAIR    . HYDROCELE EXCISION     on neck; 1968  . VIDEO BRONCHOSCOPY WITH ENDOBRONCHIAL ULTRASOUND N/A 12/09/2016   Procedure: VIDEO BRONCHOSCOPY WITH ENDOBRONCHIAL ULTRASOUND;  Surgeon: [name], MD;  Location: [facility] OR;  Service: Thoracic;  Laterality: N/A;    REVIEW OF SYSTEMS:  A comprehensive review of systems was negative.   PHYSICAL EXAMINATION: General appearance: alert, cooperative and no distress Head: Normocephalic, without obvious abnormality, atraumatic Neck: no adenopathy, no JVD, supple, symmetrical, trachea midline and thyroid not enlarged, symmetric, no tenderness/mass/nodules Lymph nodes: Cervical, supraclavicular, and axillary nodes normal. Resp: clear to auscultation bilaterally Back: symmetric, no curvature. ROM normal. No CVA tenderness. Cardio: regular rate and rhythm, S1, S2 normal, no murmur, click, rub or gallop GI: soft, non-tender; bowel sounds normal; no masses,  no organomegaly Extremities: extremities normal, atraumatic, no cyanosis or edema  ECOG PERFORMANCE STATUS: 1 - Symptomatic but completely ambulatory  Blood pressure 113/66, pulse 80, temperature 97.8 F (36.6 C), temperature source Oral, resp. rate 19, height 6' (1.829 m), weight 144 lb 3.2 oz (65.4 kg), SpO2 100 %.  LABORATORY DATA: Lab Results  Component Value Date   WBC 4.4 02/24/2017   HGB 11.2 (L) 02/24/2017   HCT 33.4 (L) 02/24/2017   MCV 92.0 02/24/2017   PLT 218 02/24/2017      Chemistry      Component Value Date/Time   NA  137 02/24/2017 1341   K 3.7 02/24/2017 1341   CL 102 12/04/2016 1553   CO2 27 02/24/2017 1341   BUN 11.1 02/24/2017 1341   CREATININE 0.8 02/24/2017 1341      Component Value Date/Time   CALCIUM 9.3 02/24/2017 1341   ALKPHOS 86 02/24/2017 1341   AST 18 02/24/2017  1341   ALT 7 02/24/2017 1341   BILITOT 0.31 02/24/2017 1341       RADIOGRAPHIC STUDIES: No results found.  ASSESSMENT AND PLAN: This is a very pleasant 76 years old African-American male recently diagnosed with a stage IIIA (T4, N1, M0) non-small cell lung cancer, favoring adenocarcinoma diagnosed in July 2018. The patient underwent a course of concurrent chemoradiation with weekly carboplatin and paclitaxel is status post 7 cycles and tolerated this treatment fairly well. I recommended for the patient to have repeat CT scan of the chest performed in 2 weeks and he would come back for follow-up visit at that time. He was advised to call immediately if he has any concerning symptoms in the interval. The patient voices understanding of current disease status and treatment options and is in agreement with the current care plan. All questions were answered. The patient knows to call the clinic with any problems, questions or concerns. We can certainly see the patient much sooner if necessary.  Disclaimer: This note was dictated with voice recognition software. Similar sounding words can inadvertently be transcribed and may not be corrected upon review.

## 2017-02-24 NOTE — Telephone Encounter (Signed)
Gave avs and calendar for October  °

## 2017-02-25 ENCOUNTER — Ambulatory Visit: Payer: Medicare Other | Admitting: Emergency Medicine

## 2017-03-06 ENCOUNTER — Institutional Professional Consult (permissible substitution): Payer: Medicare Other | Admitting: Internal Medicine

## 2017-03-12 ENCOUNTER — Ambulatory Visit (HOSPITAL_COMMUNITY)
Admission: RE | Admit: 2017-03-12 | Discharge: 2017-03-12 | Disposition: A | Discharge status: Home / Self Care | Payer: Medicare Other | Source: Ambulatory Visit | Attending: Internal Medicine | Admitting: Internal Medicine

## 2017-03-12 ENCOUNTER — Other Ambulatory Visit (HOSPITAL_BASED_OUTPATIENT_CLINIC_OR_DEPARTMENT_OTHER): Payer: Medicare Other

## 2017-03-12 DIAGNOSIS — I2510 Atherosclerotic heart disease of native coronary artery without angina pectoris: Secondary | ICD-10-CM | POA: Insufficient documentation

## 2017-03-12 DIAGNOSIS — C3431 Malignant neoplasm of lower lobe, right bronchus or lung: Secondary | ICD-10-CM

## 2017-03-12 DIAGNOSIS — Z5111 Encounter for antineoplastic chemotherapy: Secondary | ICD-10-CM

## 2017-03-12 DIAGNOSIS — J449 Chronic obstructive pulmonary disease, unspecified: Secondary | ICD-10-CM | POA: Diagnosis not present

## 2017-03-12 DIAGNOSIS — R9389 Abnormal findings on diagnostic imaging of other specified body structures: Secondary | ICD-10-CM | POA: Diagnosis not present

## 2017-03-12 DIAGNOSIS — I700 Atherosclerosis of aorta: Secondary | ICD-10-CM | POA: Diagnosis not present

## 2017-03-12 DIAGNOSIS — C3491 Malignant neoplasm of unspecified part of right bronchus or lung: Secondary | ICD-10-CM | POA: Insufficient documentation

## 2017-03-12 DIAGNOSIS — C3490 Malignant neoplasm of unspecified part of unspecified bronchus or lung: Secondary | ICD-10-CM | POA: Diagnosis not present

## 2017-03-12 LAB — COMPREHENSIVE METABOLIC PANEL
ALBUMIN: 3.4 g/dL — AB (ref 3.5–5.0)
ALK PHOS: 77 U/L (ref 40–150)
ALT: 8 U/L (ref 0–55)
AST: 18 U/L (ref 5–34)
Anion Gap: 9 mEq/L (ref 3–11)
BILIRUBIN TOTAL: 0.5 mg/dL (ref 0.20–1.20)
BUN: 14.5 mg/dL (ref 7.0–26.0)
CALCIUM: 9.4 mg/dL (ref 8.4–10.4)
CO2: 26 mEq/L (ref 22–29)
Chloride: 101 mEq/L (ref 98–109)
Creatinine: 0.8 mg/dL (ref 0.7–1.3)
GLUCOSE: 119 mg/dL (ref 70–140)
Potassium: 4.3 mEq/L (ref 3.5–5.1)
Sodium: 137 mEq/L (ref 136–145)
TOTAL PROTEIN: 7.9 g/dL (ref 6.4–8.3)

## 2017-03-12 LAB — CBC WITH DIFFERENTIAL/PLATELET
BASO%: 0.4 % (ref 0.0–2.0)
BASOS ABS: 0 10*3/uL (ref 0.0–0.1)
EOS ABS: 0.2 10*3/uL (ref 0.0–0.5)
EOS%: 3.3 % (ref 0.0–7.0)
HEMATOCRIT: 36 % — AB (ref 38.4–49.9)
HEMOGLOBIN: 12.1 g/dL — AB (ref 13.0–17.1)
LYMPH#: 0.6 10*3/uL — AB (ref 0.9–3.3)
LYMPH%: 9.2 % — ABNORMAL LOW (ref 14.0–49.0)
MCH: 31.8 pg (ref 27.2–33.4)
MCHC: 33.5 g/dL (ref 32.0–36.0)
MCV: 94.7 fL (ref 79.3–98.0)
MONO#: 1.1 10*3/uL — ABNORMAL HIGH (ref 0.1–0.9)
MONO%: 16.5 % — ABNORMAL HIGH (ref 0.0–14.0)
NEUT%: 70.6 % (ref 39.0–75.0)
NEUTROS ABS: 4.8 10*3/uL (ref 1.5–6.5)
Platelets: 226 10*3/uL (ref 140–400)
RBC: 3.8 10*6/uL — ABNORMAL LOW (ref 4.20–5.82)
RDW: 20.1 % — AB (ref 11.0–14.6)
WBC: 6.8 10*3/uL (ref 4.0–10.3)

## 2017-03-12 MED ORDER — IOPAMIDOL (ISOVUE-300) INJECTION 61%
INTRAVENOUS | Status: AC
  Filled 2017-03-12: qty 75

## 2017-03-12 MED ORDER — IOPAMIDOL (ISOVUE-300) INJECTION 61%
75.0000 mL | Freq: Once | INTRAVENOUS | Status: AC | PRN
  Administered 2017-03-12: 75 mL via INTRAVENOUS

## 2017-03-18 ENCOUNTER — Ambulatory Visit (HOSPITAL_BASED_OUTPATIENT_CLINIC_OR_DEPARTMENT_OTHER): Payer: Medicare Other | Admitting: Internal Medicine

## 2017-03-18 ENCOUNTER — Encounter: Payer: Self-pay | Admitting: *Deleted

## 2017-03-18 ENCOUNTER — Other Ambulatory Visit: Payer: Self-pay | Admitting: *Deleted

## 2017-03-18 ENCOUNTER — Encounter: Payer: Self-pay | Admitting: Internal Medicine

## 2017-03-18 ENCOUNTER — Telehealth: Payer: Self-pay | Admitting: Internal Medicine

## 2017-03-18 VITALS — BP 125/71 | HR 88 | Temp 97.6°F | Resp 20 | Ht 72.0 in | Wt 148.2 lb

## 2017-03-18 DIAGNOSIS — Z5112 Encounter for antineoplastic immunotherapy: Secondary | ICD-10-CM

## 2017-03-18 DIAGNOSIS — C3431 Malignant neoplasm of lower lobe, right bronchus or lung: Secondary | ICD-10-CM | POA: Diagnosis not present

## 2017-03-18 DIAGNOSIS — Z7189 Other specified counseling: Secondary | ICD-10-CM

## 2017-03-18 DIAGNOSIS — C3491 Malignant neoplasm of unspecified part of right bronchus or lung: Secondary | ICD-10-CM

## 2017-03-18 DIAGNOSIS — R5382 Chronic fatigue, unspecified: Secondary | ICD-10-CM

## 2017-03-18 NOTE — Progress Notes (Signed)
[facility] [phone]   [phone]  OFFICE PROGRESS NOTE  [name], MD [place] [address]  DIAGNOSIS: Stage IIIA (T4, N1, M0) non-small cell lung cancer, favoring adenocarcinoma presented with large right lower lobe lung mass in addition to right hilar lymphadenopathy and right upper lobe apical pulmonary nodule diagnosed in July 2018.  Guardant 360 testing: negative EGFR, ALK, ROS 1 and BRAF mutations.  PRIOR THERAPY: Concurrent chemoradiation with weekly carboplatin for AUC of 2 and paclitaxel 45 MG/M2. First dose 12/30/2016.status post 7 cycles.  Last dose was given 02/10/2017 with partial response.  CURRENT THERAPY: Consolidation immunotherapy with Imfinzi (Durvalumab) 10 mg/KG every 2 weeks, first dose April 08, 2017.  INTERVAL HISTORY: Harry Allen 76 y.o. male returns to the clinic today for follow-up visit accompanied by his wife. The patient is feeling much better today.  He tolerated the previous course of concurrent chemoradiation fairly well.  He denied having any current chest pain, shortness of breath, cough or hemoptysis.  He denied having any significant nausea, vomiting, diarrhea or constipation.  He has no significant dysphagia or odynophagia.  The patient has no fever or chills.  He came today for evaluation after repeating CT scan of the chest for restaging of his disease.  MEDICAL HISTORY: Past Medical History:  Diagnosis Date  . Adenocarcinoma of right lung, stage 3 ([facility]) 12/19/2016  . Cancer ([facility])    lung cancer  . COPD (chronic obstructive pulmonary disease) ([facility])   . H/O hernia repair   . Sleep apnea     ALLERGIES:  is allergic to no known allergies.  MEDICATIONS:  Current Outpatient Prescriptions  Medication Sig Dispense Refill  . albuterol (PROVENTIL HFA;VENTOLIN HFA) 108 (90 Base) MCG/ACT inhaler Inhale 2 puffs into the lungs every 4 (four) hours as needed for wheezing or shortness  of breath. 1 Inhaler 5  . hyaluronate sodium (RADIAPLEXRX) GEL Apply 1 application topically 2 (two) times daily.    . Tiotropium Bromide Monohydrate (SPIRIVA RESPIMAT) 2.5 MCG/ACT AERS Inhale 2 puffs into the lungs daily. 1 Inhaler 3   No current facility-administered medications for this visit.     SURGICAL HISTORY:  Past Surgical History:  Procedure Laterality Date  . APPENDECTOMY    . HERNIA REPAIR    . HYDROCELE EXCISION     on neck; 1968  . VIDEO BRONCHOSCOPY WITH ENDOBRONCHIAL ULTRASOUND N/A 12/09/2016   Procedure: VIDEO BRONCHOSCOPY WITH ENDOBRONCHIAL ULTRASOUND;  Surgeon: [name], MD;  Location: [facility];  Service: Thoracic;  Laterality: N/A;    REVIEW OF SYSTEMS:  Constitutional: positive for fatigue Eyes: negative Ears, nose, mouth, throat, and face: negative Respiratory: positive for cough Cardiovascular: negative Gastrointestinal: negative Genitourinary:negative Integument/breast: negative Hematologic/lymphatic: negative Musculoskeletal:negative Neurological: negative Behavioral/Psych: negative Endocrine: negative Allergic/Immunologic: negative   PHYSICAL EXAMINATION: General appearance: alert, cooperative and no distress Head: Normocephalic, without obvious abnormality, atraumatic Neck: no adenopathy, no JVD, supple, symmetrical, trachea midline and thyroid not enlarged, symmetric, no tenderness/mass/nodules Lymph nodes: Cervical, supraclavicular, and axillary nodes normal. Resp: clear to auscultation bilaterally Back: symmetric, no curvature. ROM normal. No CVA tenderness. Cardio: regular rate and rhythm, S1, S2 normal, no murmur, click, rub or gallop GI: soft, non-tender; bowel sounds normal; no masses,  no organomegaly Extremities: extremities normal, atraumatic, no cyanosis or edema Neurologic: Alert and oriented X 3, normal strength and tone. Normal symmetric reflexes. Normal coordination and gait  ECOG PERFORMANCE STATUS: 1 - Symptomatic but  completely  ambulatory  Blood pressure 125/71, pulse 88, temperature 97.6 F (36.4 C), temperature source Axillary, resp. rate 20, height 6' (1.829 m), weight 148 lb 3.2 oz (67.2 kg), SpO2 99 %.  LABORATORY DATA: Lab Results  Component Value Date   WBC 6.8 03/12/2017   HGB 12.1 (L) 03/12/2017   HCT 36.0 (L) 03/12/2017   MCV 94.7 03/12/2017   PLT 226 03/12/2017      Chemistry      Component Value Date/Time   NA 137 03/12/2017 1436   K 4.3 03/12/2017 1436   CL 102 12/04/2016 1553   CO2 26 03/12/2017 1436   BUN 14.5 03/12/2017 1436   CREATININE 0.8 03/12/2017 1436      Component Value Date/Time   CALCIUM 9.4 03/12/2017 1436   ALKPHOS 77 03/12/2017 1436   AST 18 03/12/2017 1436   ALT 8 03/12/2017 1436   BILITOT 0.50 03/12/2017 1436       RADIOGRAPHIC STUDIES: Ct Chest W Contrast  Result Date: 03/12/2017 CLINICAL DATA:  Lung cancer with chemotherapy and radiation therapy finished 2-3 weeks ago. Diagnosed in June. Quit smoking in 2015. COPD. EXAM: CT CHEST WITH CONTRAST TECHNIQUE: Multidetector CT imaging of the chest was performed during intravenous contrast administration. CONTRAST:  96m ISOVUE-300 IOPAMIDOL (ISOVUE-300) INJECTION 61% COMPARISON:  Plain films of 12/09/2016. PET of 12/05/2016. Chest CT 10/22/2016. FINDINGS: Cardiovascular: Aortic and branch vessel atherosclerosis. Normal heart size, without pericardial effusion. Multivessel coronary artery atherosclerosis. No central pulmonary embolism, on this non-dedicated study. Mediastinum/Nodes: No supraclavicular adenopathy. Resolved right hilar adenopathy. A node measures 9 mm on image 69/series 2 versus 1.6 cm on the prior diagnostic CT. No mediastinal or left hilar adenopathy. Lungs/Pleura: No pleural fluid. Moderate bullous type emphysema. Presumed secretions in the right side of the trachea. Right apical pulmonary nodule measures 11 mm on image 13/series 7 versus 1.4 cm on the prior. Decrease in size of central right  lower lobe lung lesion with mild surrounding ground-glass opacity and architectural distortion. This measures maximally 4.0 x 2.9 cm on image 77/ series 7 versus 5.3 x 5.2 cm on the 10/22/2016 exam. Clear left lung. Upper Abdomen: Vague right hepatic lobe hyper enhancement measures on the order of 11 mm on image 154/series 2 and is favored to represent a perfusion anomaly. Normal imaged portions of the spleen, stomach, adrenal glands, left kidney. 3.0 cm right renal cyst. Hypoattenuation within the pancreatic tail which may represent main and side branch duct ectasia, including on image 171/ series 2. Felt to be grossly similar on the prior CT, without significant hypermetabolism on prior PET. Musculoskeletal: No acute osseous abnormality. IMPRESSION: 1. Response to therapy of central right lower lobe lung mass and right hilar adenopathy. 2. Minimal decreased in size of a right apical pulmonary nodule. 3. No sites of new or progressive disease. 4. Coronary artery atherosclerosis. Aortic Atherosclerosis (ICD10-I70.0). 5. 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 This could be re-evaluated at follow-up. A more aggressive approach would include dedicated MRI/MRCP, preferably with and without contrast. Electronically Signed   By: KAbigail MiyamotoM.D.   On: 03/12/2017 16:00    ASSESSMENT AND PLAN: This is a very pleasant 76years old African-American male recently diagnosed with a stage IIIA (T4, N1, M0) non-small cell lung cancer, favoring adenocarcinoma diagnosed in July 2018. The patient underwent a course of concurrent chemoradiation with weekly carboplatin and paclitaxel status post 7 cycles and tolerated this treatment fairly well. He had  repeat CT scan of the chest performed recently.  I personally and independently reviewed the scan images and discussed the results and showed the images to the patient and his wife.  Has a scan showed partial  response of his disease. I had a lengthy discussion with the patient today about his treatment options.  I discussed with him the option of continuous observation and close monitoring versus consideration of treatment with consolidation immunotherapy was Imfinzi (Durvalumab) 10 mg/KG every 2 weeks for a total of 1 year unless the patient has intolerable toxicity or disease progression. I also discussed with the patient the role of immunotherapy and mechanism of action of this treatment. The patient is interested in the treatment.  I discussed with him the adverse effect of this treatment including but not limited to immunotherapy mediated skin rash, diarrhea, inflammation of the lung, kidney, liver, thyroid or other endocrine dysfunction including type 1 diabetes mellitus. He would like to start his treatment after he comes back from [place] on April 08, 2017.  He spent some times in [place] and would like to be able to receive some treatment there.  I will give him the name of some of these thoracic oncologist close to [facility]. I will see him back for follow-up visit when he comes for the first cycle of the treatment. He was advised to call immediately if he has any concerning symptoms in the interval. The patient voices understanding of current disease status and treatment options and is in agreement with the current care plan. All questions were answered. The patient knows to call the clinic with any problems, questions or concerns. We can certainly see the patient much sooner if necessary.  Disclaimer: This note was dictated with voice recognition software. Similar sounding words can inadvertently be transcribed and may not be corrected upon review.

## 2017-03-18 NOTE — Telephone Encounter (Signed)
Gave avs and calendar for November - January 2019

## 2017-03-18 NOTE — Progress Notes (Signed)
DISCONTINUE ON PATHWAY REGIMEN - Non-Small Cell Lung     Administer weekly:     Paclitaxel      Carboplatin   **Always confirm dose/schedule in your pharmacy ordering system**    REASON: Continuation Of Treatment PRIOR TREATMENT: [id number]: Carboplatin AUC=2 + Paclitaxel 45 mg/m2 Weekly During Radiation TREATMENT RESPONSE: Partial Response (PR)  START ON PATHWAY REGIMEN - Non-Small Cell Lung     A cycle is every 14 days:     Durvalumab   **Always confirm dose/schedule in your pharmacy ordering system**    Patient Characteristics: Stage III - Unresectable, PS = 0, 1 AJCC T Category: T4 Current Disease Status: No Distant Mets or Local Recurrence AJCC N Category: N1 AJCC M Category: M0 AJCC 8 Stage Grouping: IIIA Performance Status: PS = 0, 1 Intent of Therapy: Curative Intent, Discussed with Patient

## 2017-03-19 ENCOUNTER — Other Ambulatory Visit: Payer: Self-pay | Admitting: *Deleted

## 2017-03-19 ENCOUNTER — Ambulatory Visit: Payer: Medicare Other | Admitting: Emergency Medicine

## 2017-03-20 ENCOUNTER — Ambulatory Visit: Payer: Medicare Other | Admitting: Emergency Medicine

## 2017-03-28 ENCOUNTER — Telehealth: Payer: Self-pay | Admitting: Medical Oncology

## 2017-03-28 NOTE — Telephone Encounter (Signed)
Pt needs records sent to Dr Noel Christmas, MD at [facility]  [address] [place], [place]. Phone [phone].( [email or web address]). She has agreed to take him as pt. He will get some of his tx there.

## 2017-03-31 ENCOUNTER — Telehealth: Payer: Self-pay | Admitting: Internal Medicine

## 2017-03-31 NOTE — Telephone Encounter (Signed)
MAILED PT RECORDS TO DR POWERS IN [place]

## 2017-04-02 NOTE — Telephone Encounter (Signed)
Pt notified of message below.

## 2017-04-07 ENCOUNTER — Telehealth: Payer: Self-pay | Admitting: Medical Oncology

## 2017-04-07 NOTE — Telephone Encounter (Signed)
10 days ago , aches, chills -thought he had flu. He feels better now and asking if he should keep his appt for tx tomorrow. I told him to keep his appts.

## 2017-04-08 ENCOUNTER — Other Ambulatory Visit (HOSPITAL_BASED_OUTPATIENT_CLINIC_OR_DEPARTMENT_OTHER): Payer: Medicare Other

## 2017-04-08 ENCOUNTER — Ambulatory Visit (HOSPITAL_BASED_OUTPATIENT_CLINIC_OR_DEPARTMENT_OTHER): Payer: Medicare Other

## 2017-04-08 VITALS — BP 124/70 | HR 90 | Temp 97.8°F | Resp 20 | Wt 148.7 lb

## 2017-04-08 DIAGNOSIS — C3491 Malignant neoplasm of unspecified part of right bronchus or lung: Secondary | ICD-10-CM

## 2017-04-08 DIAGNOSIS — Z5112 Encounter for antineoplastic immunotherapy: Secondary | ICD-10-CM | POA: Diagnosis present

## 2017-04-08 DIAGNOSIS — Z79899 Other long term (current) drug therapy: Secondary | ICD-10-CM | POA: Diagnosis not present

## 2017-04-08 DIAGNOSIS — C3431 Malignant neoplasm of lower lobe, right bronchus or lung: Secondary | ICD-10-CM | POA: Diagnosis present

## 2017-04-08 DIAGNOSIS — R5382 Chronic fatigue, unspecified: Secondary | ICD-10-CM

## 2017-04-08 LAB — CBC WITH DIFFERENTIAL/PLATELET
BASO%: 0.4 % (ref 0.0–2.0)
Basophils Absolute: 0 10*3/uL (ref 0.0–0.1)
EOS%: 2.9 % (ref 0.0–7.0)
Eosinophils Absolute: 0.2 10*3/uL (ref 0.0–0.5)
HCT: 35.1 % — ABNORMAL LOW (ref 38.4–49.9)
HGB: 11.7 g/dL — ABNORMAL LOW (ref 13.0–17.1)
LYMPH%: 8.7 % — AB (ref 14.0–49.0)
MCH: 32.1 pg (ref 27.2–33.4)
MCHC: 33.4 g/dL (ref 32.0–36.0)
MCV: 96.1 fL (ref 79.3–98.0)
MONO#: 1.1 10*3/uL — AB (ref 0.1–0.9)
MONO%: 13.8 % (ref 0.0–14.0)
NEUT#: 5.8 10*3/uL (ref 1.5–6.5)
NEUT%: 74.2 % (ref 39.0–75.0)
PLATELETS: 293 10*3/uL (ref 140–400)
RBC: 3.65 10*6/uL — AB (ref 4.20–5.82)
RDW: 17 % — ABNORMAL HIGH (ref 11.0–14.6)
WBC: 7.9 10*3/uL (ref 4.0–10.3)
lymph#: 0.7 10*3/uL — ABNORMAL LOW (ref 0.9–3.3)

## 2017-04-08 LAB — COMPREHENSIVE METABOLIC PANEL
ALT: 7 U/L (ref 0–55)
ANION GAP: 10 meq/L (ref 3–11)
AST: 14 U/L (ref 5–34)
Albumin: 2.7 g/dL — ABNORMAL LOW (ref 3.5–5.0)
Alkaline Phosphatase: 83 U/L (ref 40–150)
BUN: 11.4 mg/dL (ref 7.0–26.0)
CHLORIDE: 100 meq/L (ref 98–109)
CO2: 24 meq/L (ref 22–29)
CREATININE: 0.8 mg/dL (ref 0.7–1.3)
Calcium: 9.2 mg/dL (ref 8.4–10.4)
GLUCOSE: 91 mg/dL (ref 70–140)
Potassium: 4 mEq/L (ref 3.5–5.1)
SODIUM: 135 meq/L — AB (ref 136–145)
Total Bilirubin: 0.41 mg/dL (ref 0.20–1.20)
Total Protein: 8.1 g/dL (ref 6.4–8.3)

## 2017-04-08 LAB — RESEARCH LABS

## 2017-04-08 MED ORDER — SODIUM CHLORIDE 0.9 % IV SOLN
Freq: Once | INTRAVENOUS | Status: AC
  Administered 2017-04-08: 16:00:00 via INTRAVENOUS

## 2017-04-08 MED ORDER — SODIUM CHLORIDE 0.9 % IV SOLN
11.0000 mg/kg | Freq: Once | INTRAVENOUS | Status: AC
  Administered 2017-04-08: 740 mg via INTRAVENOUS
  Filled 2017-04-08: qty 4.8

## 2017-04-08 NOTE — Patient Instructions (Signed)
Durvalumab injection  What is this medicine?  DURVALUMAB (dur VAL ue mab) is a monoclonal antibody. It is used to treat urothelial cancer.  This medicine may be used for other purposes; ask your health care provider or pharmacist if you have questions.  COMMON BRAND NAME(S): IMFINZI  What should I tell my health care provider before I take this medicine?  They need to know if you have any of these conditions:  -diabetes  -immune system problems  -infection  -inflammatory bowel disease  -kidney disease  -liver disease  -lung or breathing disease  -lupus  -organ transplant  -stomach or intestine problems  -thyroid disease  -an unusual or allergic reaction to durvalumab, other medicines, foods, dyes, or preservatives  -pregnant or trying to get pregnant  -breast-feeding  How should I use this medicine?  This medicine is for infusion into a vein. It is given by a health care professional in a hospital or clinic setting.  A special MedGuide will be given to you before each treatment. Be sure to read this information carefully each time.  Talk to your pediatrician regarding the use of this medicine in children. Special care may be needed.  Overdosage: If you think you have taken too much of this medicine contact a poison control center or emergency room at once.  NOTE: This medicine is only for you. Do not share this medicine with others.  What if I miss a dose?  It is important not to miss your dose. Call your doctor or health care professional if you are unable to keep an appointment.  What may interact with this medicine?  Interactions have not been studied.  This list may not describe all possible interactions. Give your health care provider a list of all the medicines, herbs, non-prescription drugs, or dietary supplements you use. Also tell them if you smoke, drink alcohol, or use illegal drugs. Some items may interact with your medicine.  What should I watch for while using this medicine?  This drug may make you  feel generally unwell. Continue your course of treatment even though you feel ill unless your doctor tells you to stop.  You may need blood work done while you are taking this medicine.  Do not become pregnant while taking this medicine or for 3 months after stopping it. Women should inform their doctor if they wish to become pregnant or think they might be pregnant. There is a potential for serious side effects to an unborn child. Talk to your health care professional or pharmacist for more information. Do not breast-feed an infant while taking this medicine or for 3 months after stopping it.  What side effects may I notice from receiving this medicine?  Side effects that you should report to your doctor or health care professional as soon as possible:  -allergic reactions like skin rash, itching or hives, swelling of the face, lips, or tongue  -black, tarry stools  -bloody or watery diarrhea  -breathing problems  -change in emotions or moods  -change in sex drive  -changes in vision  -chest pain or chest tightness  -chills  -confusion  -cough  -facial flushing  -fever  -headache  -signs and symptoms of high blood sugar such as dizziness; dry mouth; dry skin; fruity breath; nausea; stomach pain; increased hunger or thirst; increased urination  -signs and symptoms of liver injury like dark yellow or brown urine; general ill feeling or flu-like symptoms; light-colored stools; loss of appetite; nausea; right upper belly pain;   unusually weak or tired; yellowing of the eyes or skin  -stomach pain  -trouble passing urine or change in the amount of urine  -weight gain or weight loss  Side effects that usually do not require medical attention (report these to your doctor or health care professional if they continue or are bothersome):  -bone pain  -constipation  -loss of appetite  -muscle pain  -nausea  -swelling of the ankles, feet, hands  -tiredness  This list may not describe all possible side effects. Call your doctor  for medical advice about side effects. You may report side effects to FDA at 1-800-FDA-1088.  Where should I keep my medicine?  This drug is given in a hospital or clinic and will not be stored at home.  NOTE: This sheet is a summary. It may not cover all possible information. If you have questions about this medicine, talk to your doctor, pharmacist, or health care provider.  © 2018 Elsevier/Gold Standard (2015-12-08 15:50:36)

## 2017-04-09 ENCOUNTER — Telehealth: Payer: Self-pay | Admitting: Medical Oncology

## 2017-04-09 LAB — TSH: TSH: 0.701 m[IU]/L (ref 0.320–4.118)

## 2017-04-09 NOTE — Telephone Encounter (Signed)
Spoke to wife . He is extremely tired . I instructed wife to call for any symptoms.

## 2017-04-18 ENCOUNTER — Telehealth: Payer: Self-pay | Admitting: Medical Oncology

## 2017-04-18 NOTE — Telephone Encounter (Signed)
Wife called to cancel cycle 2 day 1,  Dec 4th appts -pt getting next tx in [place]. Done.

## 2017-04-21 DIAGNOSIS — R0609 Other forms of dyspnea: Secondary | ICD-10-CM | POA: Diagnosis not present

## 2017-04-21 DIAGNOSIS — C3481 Malignant neoplasm of overlapping sites of right bronchus and lung: Secondary | ICD-10-CM | POA: Diagnosis not present

## 2017-04-22 ENCOUNTER — Other Ambulatory Visit: Payer: Medicare Other

## 2017-04-22 ENCOUNTER — Ambulatory Visit: Payer: Medicare Other | Admitting: Internal Medicine

## 2017-04-22 ENCOUNTER — Ambulatory Visit: Payer: Medicare Other

## 2017-04-22 DIAGNOSIS — Z5111 Encounter for antineoplastic chemotherapy: Secondary | ICD-10-CM | POA: Diagnosis not present

## 2017-04-22 DIAGNOSIS — C3490 Malignant neoplasm of unspecified part of unspecified bronchus or lung: Secondary | ICD-10-CM | POA: Diagnosis not present

## 2017-04-22 DIAGNOSIS — E039 Hypothyroidism, unspecified: Secondary | ICD-10-CM | POA: Diagnosis not present

## 2017-04-22 DIAGNOSIS — C3491 Malignant neoplasm of unspecified part of right bronchus or lung: Secondary | ICD-10-CM | POA: Diagnosis not present

## 2017-05-01 DIAGNOSIS — J40 Bronchitis, not specified as acute or chronic: Secondary | ICD-10-CM | POA: Diagnosis not present

## 2017-05-01 DIAGNOSIS — I2510 Atherosclerotic heart disease of native coronary artery without angina pectoris: Secondary | ICD-10-CM | POA: Diagnosis not present

## 2017-05-01 DIAGNOSIS — T451X5A Adverse effect of antineoplastic and immunosuppressive drugs, initial encounter: Secondary | ICD-10-CM | POA: Diagnosis not present

## 2017-05-01 DIAGNOSIS — R05 Cough: Secondary | ICD-10-CM | POA: Diagnosis not present

## 2017-05-01 DIAGNOSIS — D701 Agranulocytosis secondary to cancer chemotherapy: Secondary | ICD-10-CM | POA: Diagnosis not present

## 2017-05-01 DIAGNOSIS — R918 Other nonspecific abnormal finding of lung field: Secondary | ICD-10-CM | POA: Diagnosis not present

## 2017-05-02 ENCOUNTER — Ambulatory Visit (INDEPENDENT_AMBULATORY_CARE_PROVIDER_SITE_OTHER): Payer: Medicare Other | Admitting: Emergency Medicine

## 2017-05-02 ENCOUNTER — Encounter: Payer: Self-pay | Admitting: Emergency Medicine

## 2017-05-02 ENCOUNTER — Telehealth: Payer: Self-pay | Admitting: Internal Medicine

## 2017-05-02 VITALS — BP 102/64 | HR 89 | Ht 72.0 in | Wt 142.0 lb

## 2017-05-02 DIAGNOSIS — J40 Bronchitis, not specified as acute or chronic: Secondary | ICD-10-CM | POA: Diagnosis not present

## 2017-05-02 DIAGNOSIS — J90 Pleural effusion, not elsewhere classified: Secondary | ICD-10-CM | POA: Diagnosis not present

## 2017-05-02 DIAGNOSIS — J449 Chronic obstructive pulmonary disease, unspecified: Secondary | ICD-10-CM

## 2017-05-02 DIAGNOSIS — J209 Acute bronchitis, unspecified: Secondary | ICD-10-CM | POA: Insufficient documentation

## 2017-05-02 DIAGNOSIS — J439 Emphysema, unspecified: Secondary | ICD-10-CM | POA: Diagnosis not present

## 2017-05-02 LAB — PULMONARY FUNCTION TEST
FEF 25-75 Pre: 0.71 L/sec
FEF2575-%Pred-Pre: 29 %
FEV1-%Pred-Pre: 51 %
FEV1-Pre: 1.52 L
FEV1FVC-%Pred-Pre: 77 %
FEV6-%Pred-Pre: 68 %
FEV6-Pre: 2.61 L
FEV6FVC-%Pred-Pre: 105 %
FVC-%Pred-Pre: 65 %
FVC-Pre: 2.61 L
Pre FEV1/FVC ratio: 58 %
Pre FEV6/FVC Ratio: 100 %

## 2017-05-02 MED ORDER — IPRATROPIUM-ALBUTEROL 0.5-2.5 (3) MG/3ML IN SOLN: 3.0000 mL | RESPIRATORY_TRACT | 5 refills | Status: DC

## 2017-05-02 MED ORDER — IPRATROPIUM-ALBUTEROL 0.5-2.5 (3) MG/3ML IN SOLN: 3.0000 mL | Freq: Four times a day (QID) | RESPIRATORY_TRACT | 5 refills | Status: DC | PRN

## 2017-05-02 MED ORDER — LEVOFLOXACIN 500 MG PO TABS: 500.0000 mg | ORAL_TABLET | Freq: Every day | ORAL | 0 refills | Status: DC

## 2017-05-02 NOTE — Assessment & Plan Note (Signed)
Evolving purulent sputum and an acute bronchitis in the aftermath of apparent viral upper respiratory infection.  He had a chest x-ray earlier today but I do not have this available.  I do not hear any adventitious sounds on lung exam.  I will treat him with Levaquin for 7 days, stop his existing azithromycin.

## 2017-05-02 NOTE — Progress Notes (Signed)
PFT was not completed today 05/02/17 due to patient being sick.

## 2017-05-02 NOTE — Assessment & Plan Note (Signed)
Following with Dr. Julien Nordmann.  His CT scan from 03/12/17 showed clinical response.  He is now on immunotherapy

## 2017-05-02 NOTE — Assessment & Plan Note (Signed)
He would not do pulmonary function testing.  He was not sure that Spiriva helped him significantly.  He is willing to try a less expensive bronchodilator.  I will order DuoNeb as needed.

## 2017-05-02 NOTE — Progress Notes (Signed)
Subjective:    Patient ID: Harry Allen, male    DOB: 1941/02/14, 76 y.o.   MRN: [id number]  COPD  He complains of cough and shortness of breath. There is no wheezing. Pertinent negatives include no ear pain, fever, headaches, postnasal drip, rhinorrhea, sneezing, sore throat or trouble swallowing. His past medical history is significant for COPD.   76 year old former smoker (64 pk-yrs) with newly diagnosed stage IIIa non-small cell lung cancer, likely adenocarcinoma with a right lower lobe mass and right hilar lymphadenopathy. The diagnosis was made in July 2018 by bronchoscopy after he had some scant hemoptysis. He is undergoing concurrent chemoradiation. His CT scan of the chest shows emphysematous changes in addition to his right upper lobe mass. He presents today to discuss COPD, dyspnea, cough. He has exertional dyspnea when he tries to bike, walk. Occasionally hears wheeze. He has cough that occurs most of the day, productive of thick clear mucous.   He tells me that he has OSA, has not been on CPAP for many years.   ROV 05/02/17 --this is a follow-up visit for patient with a history of stage IIIa non-small cell lung cancer, undergoing chemoradiation and now on immunotherapy, also probable COPD with dyspnea and cough. He is starting to experience some  He also has a history of sleep apnea not on CPAP.  We discussed possibly changing him to nasal pillows but he did not bring in his old sleep study.  At his original visit we intended to start Spiriva to see if he would benefit. He wasn't sure that it helped him. He doesn't want to start it.   He began to experience some increased nasal gtt, green chest congestion and cough. She was started on   Repeat CT Chest 03/12/17 reviewed by me.  Shows good clinical response of his known adenocarcinoma.   Review of Systems  Constitutional: Negative for fever and unexpected weight change.  HENT: Negative for congestion, dental problem, ear pain,  nosebleeds, postnasal drip, rhinorrhea, sinus pressure, sneezing, sore throat and trouble swallowing.   Eyes: Negative for redness and itching.  Respiratory: Positive for cough and shortness of breath. Negative for chest tightness and wheezing.   Cardiovascular: Negative for palpitations and leg swelling.  Gastrointestinal: Negative for nausea and vomiting.  Genitourinary: Negative for dysuria.  Musculoskeletal: Negative for joint swelling.  Skin: Negative for rash.  Neurological: Negative for headaches.  Hematological: Does not bruise/bleed easily.  Psychiatric/Behavioral: Negative for dysphoric mood. The patient is not nervous/anxious.     Past Medical History:  Diagnosis Date  . Adenocarcinoma of right lung, stage 3 ([place]) 12/19/2016  . Cancer ([place])    lung cancer  . COPD (chronic obstructive pulmonary disease) ([place])   . H/O hernia repair   . Sleep apnea      Family History  Problem Relation Age of Onset  . Cancer Father        pancreatic     Social History   Socioeconomic History  . Marital status: Married    Spouse name: Not on file  . Number of children: Not on file  . Years of education: Not on file  . Highest education level: Not on file  Social Needs  . Financial resource strain: Not on file  . Food insecurity - worry: Not on file  . Food insecurity - inability: Not on file  . Transportation needs - medical: Not on file  . Transportation needs - non-medical: Not on file  Occupational History  .  Not on file  Tobacco Use  . Smoking status: Former Smoker    Packs/day: 1.00    Years: 8.00    Pack years: 8.00    Last attempt to quit: 11/19/2013    Years since quitting: 3.4  . Smokeless tobacco: Never Used  Substance and Sexual Activity  . Alcohol use: No  . Drug use: No  . Sexual activity: No  Other Topics Concern  . Not on file  Social History Narrative  . Not on file  Worked as a professor, psychology, Economist.  Was in the [employer], lived in [place].  Has  lived in [place], [place], [place], [place], [place], [place], [place]  Allergies  Allergen Reactions  . No Known Allergies      Outpatient Medications Prior to Visit  Medication Sig Dispense Refill  . aspirin EC 81 MG tablet Take by mouth.    . dexamethasone (DECADRON) 0.5 MG tablet Take by mouth.    . diphenhydrAMINE (BENYLIN) 12.5 MG/5ML syrup Take by mouth.    . hyaluronate sodium (RADIAPLEXRX) GEL Apply 1 application topically 2 (two) times daily.    . pravastatin (PRAVACHOL) 40 MG tablet Take by mouth.    . Tiotropium Bromide Monohydrate (SPIRIVA RESPIMAT) 2.5 MCG/ACT AERS Inhale 2 puffs into the lungs daily. (Patient not taking: Reported on 05/02/2017) 1 Inhaler 3   No facility-administered medications prior to visit.         Objective:   Physical Exam Vitals:   05/02/17 1507 05/02/17 1508  BP:  102/64  Pulse:  89  SpO2:  92%  Weight: 142 lb (64.4 kg)   Height: 6' (1.829 m)    Gen: Pleasant, thin, in no distress,  normal affect  ENT: No lesions,  mouth clear,  oropharynx clear, no postnasal drip  Neck: No JVD, no stridor  Lungs: No use of accessory muscles, distant but clear, no wheeze  Cardiovascular: RRR, heart sounds normal, no murmur or gallops, no peripheral edema  Musculoskeletal: No deformities, no cyanosis or clubbing  Neuro: alert, non focal  Skin: Warm, no lesions or rash      Assessment & Plan:  COPD (chronic obstructive pulmonary disease) ([place]) He would not do pulmonary function testing.  He was not sure that Spiriva helped him significantly.  He is willing to try a less expensive bronchodilator.  I will order DuoNeb as needed.  Adenocarcinoma of right lung, stage 3 ([facility]) Following with Dr. Julien Nordmann.  His CT scan from 03/12/17 showed clinical response.  He is now on immunotherapy  Obstructive sleep apnea Untreated  Acute bronchitis Evolving purulent sputum and an acute bronchitis in the aftermath of apparent viral upper respiratory infection.  He had a chest x-ray earlier  today but I do not have this available.  I do not hear any adventitious sounds on lung exam.  I will treat him with Levaquin for 7 days, stop his existing azithromycin.  Baltazar Apo, MD, PhD 05/02/2017, 3:27 PM [facility] [phone] or if no answer [phone]

## 2017-05-02 NOTE — Telephone Encounter (Signed)
Scheduled appt per 12/14 sch msg - left message for patient regarding appts

## 2017-05-02 NOTE — Assessment & Plan Note (Signed)
Untreated  

## 2017-05-02 NOTE — Patient Instructions (Addendum)
Stop azithromycin.  Please take levaquin 500mg  once a day for 7 days.  We will start DuoNeb.  Take 1 nebulization treatment up to every 6 hours if needed for shortness of breath.  Do not exceed more than 4 times in 1 day. Follow with Dr Julien Nordmann as planned  Follow with Dr Lamonte Sakai in 6 months or sooner if you have any problems

## 2017-05-06 ENCOUNTER — Telehealth: Payer: Self-pay | Admitting: Internal Medicine

## 2017-05-06 ENCOUNTER — Other Ambulatory Visit (HOSPITAL_BASED_OUTPATIENT_CLINIC_OR_DEPARTMENT_OTHER): Payer: Medicare Other

## 2017-05-06 ENCOUNTER — Ambulatory Visit (HOSPITAL_BASED_OUTPATIENT_CLINIC_OR_DEPARTMENT_OTHER): Payer: Medicare Other

## 2017-05-06 ENCOUNTER — Encounter: Payer: Self-pay | Admitting: Internal Medicine

## 2017-05-06 ENCOUNTER — Ambulatory Visit (HOSPITAL_BASED_OUTPATIENT_CLINIC_OR_DEPARTMENT_OTHER): Payer: Medicare Other | Admitting: Internal Medicine

## 2017-05-06 VITALS — BP 112/67 | HR 88 | Temp 97.5°F | Resp 20 | Ht 72.0 in | Wt 142.4 lb

## 2017-05-06 DIAGNOSIS — Z5112 Encounter for antineoplastic immunotherapy: Secondary | ICD-10-CM | POA: Diagnosis present

## 2017-05-06 DIAGNOSIS — C3431 Malignant neoplasm of lower lobe, right bronchus or lung: Secondary | ICD-10-CM | POA: Diagnosis not present

## 2017-05-06 DIAGNOSIS — Z79899 Other long term (current) drug therapy: Secondary | ICD-10-CM | POA: Diagnosis not present

## 2017-05-06 DIAGNOSIS — C3491 Malignant neoplasm of unspecified part of right bronchus or lung: Secondary | ICD-10-CM

## 2017-05-06 DIAGNOSIS — R5382 Chronic fatigue, unspecified: Secondary | ICD-10-CM

## 2017-05-06 LAB — CBC WITH DIFFERENTIAL/PLATELET
BASO%: 0.4 % (ref 0.0–2.0)
Basophils Absolute: 0 10*3/uL (ref 0.0–0.1)
EOS%: 3.2 % (ref 0.0–7.0)
Eosinophils Absolute: 0.3 10*3/uL (ref 0.0–0.5)
HEMATOCRIT: 37.5 % — AB (ref 38.4–49.9)
HGB: 12.5 g/dL — ABNORMAL LOW (ref 13.0–17.1)
LYMPH#: 1 10*3/uL (ref 0.9–3.3)
LYMPH%: 12.4 % — ABNORMAL LOW (ref 14.0–49.0)
MCH: 31.7 pg (ref 27.2–33.4)
MCHC: 33.3 g/dL (ref 32.0–36.0)
MCV: 95.2 fL (ref 79.3–98.0)
MONO#: 0.7 10*3/uL (ref 0.1–0.9)
MONO%: 8.9 % (ref 0.0–14.0)
NEUT%: 75.1 % — ABNORMAL HIGH (ref 39.0–75.0)
NEUTROS ABS: 6.2 10*3/uL (ref 1.5–6.5)
PLATELETS: 336 10*3/uL (ref 140–400)
RBC: 3.94 10*6/uL — ABNORMAL LOW (ref 4.20–5.82)
RDW: 13.6 % (ref 11.0–14.6)
WBC: 8.2 10*3/uL (ref 4.0–10.3)

## 2017-05-06 LAB — COMPREHENSIVE METABOLIC PANEL
ALT: 7 U/L (ref 0–55)
ANION GAP: 10 meq/L (ref 3–11)
AST: 15 U/L (ref 5–34)
Albumin: 2.6 g/dL — ABNORMAL LOW (ref 3.5–5.0)
Alkaline Phosphatase: 86 U/L (ref 40–150)
BILIRUBIN TOTAL: 0.22 mg/dL (ref 0.20–1.20)
BUN: 8.8 mg/dL (ref 7.0–26.0)
CALCIUM: 9.2 mg/dL (ref 8.4–10.4)
CO2: 23 meq/L (ref 22–29)
CREATININE: 0.9 mg/dL (ref 0.7–1.3)
Chloride: 102 mEq/L (ref 98–109)
EGFR: >60 mL/min/{1.73_m2} (ref >60)
Glucose: 117 mg/dl (ref 70–140)
Potassium: 4 mEq/L (ref 3.5–5.1)
Sodium: 134 mEq/L — ABNORMAL LOW (ref 136–145)
TOTAL PROTEIN: 8.2 g/dL (ref 6.4–8.3)

## 2017-05-06 LAB — TSH: TSH: 1.22 m[IU]/L (ref 0.320–4.118)

## 2017-05-06 MED ORDER — SODIUM CHLORIDE 0.9 % IV SOLN
620.0000 mg | Freq: Once | INTRAVENOUS | Status: AC
  Administered 2017-05-06: 620 mg via INTRAVENOUS
  Filled 2017-05-06: qty 10

## 2017-05-06 MED ORDER — SODIUM CHLORIDE 0.9 % IV SOLN
Freq: Once | INTRAVENOUS | Status: AC
  Administered 2017-05-06: 16:00:00 via INTRAVENOUS

## 2017-05-06 NOTE — Progress Notes (Signed)
[facility] [phone]   Fax:(336) [phone]  OFFICE PROGRESS NOTE  [name], MD [place] [address]  DIAGNOSIS: Stage IIIA (T4, N1, M0) non-small cell lung cancer, favoring adenocarcinoma presented with large right lower lobe lung mass in addition to right hilar lymphadenopathy and right upper lobe apical pulmonary nodule diagnosed in July 2018.  Guardant 360 testing: negative EGFR, ALK, ROS 1 and BRAF mutations.  PRIOR THERAPY: Concurrent chemoradiation with weekly carboplatin for AUC of 2 and paclitaxel 45 MG/M2. First dose 12/30/2016.status post 7 cycles.  Last dose was given 02/10/2017 with partial response.  CURRENT THERAPY: Consolidation immunotherapy with Imfinzi (Durvalumab) 10 mg/KG every 2 weeks, first dose April 08, 2017.  Status post 3 cycles.  He received cycle 3 of this treatment in [place].  INTERVAL HISTORY: Harry Allen 76 y.o. male returns to the clinic today for follow-up visit.  The patient is feeling fine today with no specific complaints except for recent chest congestion and bronchitis.  He is currently on treatment with Levaquin.  He has 1 more day of treatment with antibiotics.  He denied having any chest pain or hemoptysis.  He denied having any fever or chills.  He has no nausea, vomiting, diarrhea or constipation.  He received cycle #3 of his immunotherapy in [place]. He is here today for evaluation and to resume his treatment with immunotherapy.  MEDICAL HISTORY: Past Medical History:  Diagnosis Date  . Adenocarcinoma of right lung, stage 3 ([facility]) 12/19/2016  . Cancer ([facility])    lung cancer  . COPD (chronic obstructive pulmonary disease) ([facility])   . H/O hernia repair   . Sleep apnea     ALLERGIES:  is allergic to no known allergies.  MEDICATIONS:  Current Outpatient Medications  Medication Sig Dispense Refill  . amoxicillin-clavulanate (AUGMENTIN) 875-125 MG tablet Take by mouth.    Marland Kitchen  aspirin EC 81 MG tablet Take by mouth.    Marland Kitchen guaiFENesin (MUCINEX) 600 MG 12 hr tablet Take by mouth 2 (two) times daily.    Marland Kitchen ipratropium-albuterol (DUONEB) 0.5-2.5 (3) MG/3ML SOLN Take 3 mLs by nebulization every 6 (six) hours as needed. And as needed 360 mL 5  . levofloxacin (LEVAQUIN) 500 MG tablet Take 1 tablet (500 mg total) by mouth daily. 7 tablet 0  . Tiotropium Bromide Monohydrate (SPIRIVA RESPIMAT) 2.5 MCG/ACT AERS Inhale 2 puffs into the lungs daily. 1 Inhaler 3   No current facility-administered medications for this visit.     SURGICAL HISTORY:  Past Surgical History:  Procedure Laterality Date  . APPENDECTOMY    . HERNIA REPAIR    . HYDROCELE EXCISION     on neck; 1968  . VIDEO BRONCHOSCOPY WITH ENDOBRONCHIAL ULTRASOUND N/A 12/09/2016   Procedure: VIDEO BRONCHOSCOPY WITH ENDOBRONCHIAL ULTRASOUND;  Surgeon: [name], MD;  Location: [facility] OR;  Service: Thoracic;  Laterality: N/A;    REVIEW OF SYSTEMS:  A comprehensive review of systems was negative except for: Constitutional: positive for fatigue Ears, nose, mouth, throat, and face: positive for nasal congestion Respiratory: positive for cough   PHYSICAL EXAMINATION: General appearance: alert, cooperative and no distress Head: Normocephalic, without obvious abnormality, atraumatic Neck: no adenopathy, no JVD, supple, symmetrical, trachea midline and thyroid not enlarged, symmetric, no tenderness/mass/nodules Lymph nodes: Cervical, supraclavicular, and axillary nodes normal. Resp: clear to auscultation bilaterally Back: symmetric, no curvature. ROM normal. No CVA tenderness. Cardio: regular rate and rhythm, S1, S2 normal, no murmur, click,  rub or gallop GI: soft, non-tender; bowel sounds normal; no masses,  no organomegaly Extremities: extremities normal, atraumatic, no cyanosis or edema  ECOG PERFORMANCE STATUS: 1 - Symptomatic but completely ambulatory  Blood pressure 112/67, pulse 88, temperature (!) 97.5 F  (36.4 C), temperature source Axillary, resp. rate 20, height 6' (1.829 m), weight 142 lb 6.4 oz (64.6 kg), SpO2 97 %.  LABORATORY DATA: Lab Results  Component Value Date   WBC 8.2 05/06/2017   HGB 12.5 (L) 05/06/2017   HCT 37.5 (L) 05/06/2017   MCV 95.2 05/06/2017   PLT 336 05/06/2017      Chemistry      Component Value Date/Time   NA 135 (L) 04/08/2017 1449   K 4.0 04/08/2017 1449   CL 102 12/04/2016 1553   CO2 24 04/08/2017 1449   BUN 11.4 04/08/2017 1449   CREATININE 0.8 04/08/2017 1449      Component Value Date/Time   CALCIUM 9.2 04/08/2017 1449   ALKPHOS 83 04/08/2017 1449   AST 14 04/08/2017 1449   ALT 7 04/08/2017 1449   BILITOT 0.41 04/08/2017 1449       RADIOGRAPHIC STUDIES: No results found.  ASSESSMENT AND PLAN: This is a very pleasant 76 years old African-American male recently diagnosed with a stage IIIA (T4, N1, M0) non-small cell lung cancer, favoring adenocarcinoma diagnosed in July 2018. The patient underwent a course of concurrent chemoradiation with weekly carboplatin and paclitaxel status post 7 cycles and tolerated this treatment fairly well.  He has partial response to this treatment. He is currently undergoing consolidation treatment with immunotherapy with Imfinzi (Durvalumab) status post 3 cycles.  He received some of his treatment in [place]. We will proceed with cycle #4 today as a scheduled. The patient will come back for follow-up visit in 2 weeks for evaluation before starting cycle #5. He was advised to call immediately if he has any concerning symptoms in the interval. The patient voices understanding of current disease status and treatment options and is in agreement with the current care plan. All questions were answered. The patient knows to call the clinic with any problems, questions or concerns. We can certainly see the patient much sooner if necessary.  Disclaimer: This note was dictated with voice recognition software. Similar  sounding words can inadvertently be transcribed and may not be corrected upon review.

## 2017-05-06 NOTE — Patient Instructions (Signed)
[facility] Discharge Instructions for Patients Receiving Immunotherapy  Today you received the following agents: Imfinzi If you develop nausea and vomiting that is not controlled by your nausea medication, call the clinic.   BELOW ARE SYMPTOMS THAT SHOULD BE REPORTED IMMEDIATELY:  *FEVER GREATER THAN 100.5 F  *CHILLS WITH OR WITHOUT FEVER  NAUSEA AND VOMITING THAT IS NOT CONTROLLED WITH YOUR NAUSEA MEDICATION  *UNUSUAL SHORTNESS OF BREATH  *UNUSUAL BRUISING OR BLEEDING  TENDERNESS IN MOUTH AND THROAT WITH OR WITHOUT PRESENCE OF ULCERS  *URINARY PROBLEMS  *BOWEL PROBLEMS  UNUSUAL RASH Items with * indicate a potential emergency and should be followed up as soon as possible.  Feel free to call the clinic should you have any questions or concerns. The clinic phone number is [phone].  Please show the [facility] at check-in to the Emergency Department and triage nurse.

## 2017-05-06 NOTE — Telephone Encounter (Signed)
Gave patients wife AVS and calendar of upcoming January and February appointments. Patients wife canceled January appointments because patient will receive treatment in [place]. Patients wife adjusted all future appointments per patient being out of town.

## 2017-05-08 DIAGNOSIS — I2510 Atherosclerotic heart disease of native coronary artery without angina pectoris: Secondary | ICD-10-CM | POA: Diagnosis not present

## 2017-05-08 DIAGNOSIS — J209 Acute bronchitis, unspecified: Secondary | ICD-10-CM | POA: Diagnosis not present

## 2017-05-08 DIAGNOSIS — M533 Sacrococcygeal disorders, not elsewhere classified: Secondary | ICD-10-CM | POA: Diagnosis not present

## 2017-05-08 DIAGNOSIS — C3492 Malignant neoplasm of unspecified part of left bronchus or lung: Secondary | ICD-10-CM | POA: Diagnosis not present

## 2017-05-16 ENCOUNTER — Telehealth: Payer: Self-pay | Admitting: Oncology

## 2017-05-16 ENCOUNTER — Ambulatory Visit (HOSPITAL_BASED_OUTPATIENT_CLINIC_OR_DEPARTMENT_OTHER): Payer: Medicare Other | Admitting: Oncology

## 2017-05-16 ENCOUNTER — Encounter: Payer: Self-pay | Admitting: Oncology

## 2017-05-16 ENCOUNTER — Other Ambulatory Visit (HOSPITAL_BASED_OUTPATIENT_CLINIC_OR_DEPARTMENT_OTHER): Payer: Medicare Other

## 2017-05-16 VITALS — BP 111/64 | HR 77 | Temp 97.5°F | Resp 16 | Wt 147.4 lb

## 2017-05-16 DIAGNOSIS — C3431 Malignant neoplasm of lower lobe, right bronchus or lung: Secondary | ICD-10-CM

## 2017-05-16 DIAGNOSIS — Z5112 Encounter for antineoplastic immunotherapy: Secondary | ICD-10-CM

## 2017-05-16 DIAGNOSIS — R05 Cough: Secondary | ICD-10-CM

## 2017-05-16 DIAGNOSIS — C3491 Malignant neoplasm of unspecified part of right bronchus or lung: Secondary | ICD-10-CM

## 2017-05-16 LAB — CBC WITH DIFFERENTIAL/PLATELET
BASO%: 0.6 % (ref 0.0–2.0)
Basophils Absolute: 0.1 10*3/uL (ref 0.0–0.1)
EOS ABS: 0.3 10*3/uL (ref 0.0–0.5)
EOS%: 3.3 % (ref 0.0–7.0)
HCT: 36.9 % — ABNORMAL LOW (ref 38.4–49.9)
HGB: 12.2 g/dL — ABNORMAL LOW (ref 13.0–17.1)
LYMPH%: 9.8 % — AB (ref 14.0–49.0)
MCH: 31.3 pg (ref 27.2–33.4)
MCHC: 33.1 g/dL (ref 32.0–36.0)
MCV: 94.7 fL (ref 79.3–98.0)
MONO#: 0.8 10*3/uL (ref 0.1–0.9)
MONO%: 9.8 % (ref 0.0–14.0)
NEUT%: 76.5 % — AB (ref 39.0–75.0)
NEUTROS ABS: 6.6 10*3/uL — AB (ref 1.5–6.5)
Platelets: 320 10*3/uL (ref 140–400)
RBC: 3.89 10*6/uL — AB (ref 4.20–5.82)
RDW: 14.7 % — ABNORMAL HIGH (ref 11.0–14.6)
WBC: 8.6 10*3/uL (ref 4.0–10.3)
lymph#: 0.8 10*3/uL — ABNORMAL LOW (ref 0.9–3.3)

## 2017-05-16 LAB — COMPREHENSIVE METABOLIC PANEL
ALT: 12 U/L (ref 0–55)
ANION GAP: 9 meq/L (ref 3–11)
AST: 19 U/L (ref 5–34)
Albumin: 2.9 g/dL — ABNORMAL LOW (ref 3.5–5.0)
Alkaline Phosphatase: 96 U/L (ref 40–150)
BUN: 10.9 mg/dL (ref 7.0–26.0)
CHLORIDE: 102 meq/L (ref 98–109)
CO2: 26 meq/L (ref 22–29)
Calcium: 9.3 mg/dL (ref 8.4–10.4)
Creatinine: 0.8 mg/dL (ref 0.7–1.3)
GLUCOSE: 80 mg/dL (ref 70–140)
POTASSIUM: 4.2 meq/L (ref 3.5–5.1)
SODIUM: 137 meq/L (ref 136–145)
TOTAL PROTEIN: 8 g/dL (ref 6.4–8.3)
Total Bilirubin: 0.3 mg/dL (ref 0.20–1.20)

## 2017-05-16 MED ORDER — CLOTRIMAZOLE-BETAMETHASONE 1-0.05 % EX CREA: 1.0000 "application " | TOPICAL_CREAM | Freq: Two times a day (BID) | CUTANEOUS | 1 refills | Status: DC | PRN

## 2017-05-16 NOTE — Assessment & Plan Note (Signed)
This is a very pleasant 76 year old African-American male recently diagnosed with a stage IIIA (T4, N1, M0) non-small cell lung cancer, favoring adenocarcinoma diagnosed in July 2018. The patient underwent a course of concurrent chemoradiation with weekly carboplatin and paclitaxel status post 7 cycles and tolerated this treatment fairly well.  He has partial response to this treatment. He is currently undergoing consolidation treatment with immunotherapy with Imfinzi (Durvalumab) status post 3 cycles.  He received some of his treatment in [place]. We will proceed with cycle #4 next week as scheduled.  The patient will be in [place] for the month of January and will receive 2 cycles of his Imfinzi while out there.  He is scheduled to return back to [place] in February for evaluation prior to cycle #7.  I have ordered a restaging CT scan of the chest prior to that visit.  For his cough, I recommend that he use Delsym 5 cc twice a day as needed.  He was advised to call immediately if he has any concerning symptoms in the interval. All questions were answered. The patient knows to call the clinic with any problems, questions or concerns. We can certainly see the patient much sooner if necessary.

## 2017-05-16 NOTE — Progress Notes (Signed)
[facility] OFFICE PROGRESS NOTE  Badger, Michael C, MD [address] [place] [address]  DIAGNOSIS: Stage IIIA (T4, N1, M0) non-small cell lung cancer, favoring adenocarcinoma presented with large right lower lobe lung mass in addition to right hilar lymphadenopathy and right upper lobe apical pulmonary nodule diagnosed in July 2018.  Guardant 360 testing: negative EGFR, ALK, ROS 1 and BRAF mutations.  PRIOR THERAPY: Concurrent chemoradiation with weekly carboplatin for AUC of 2 and paclitaxel 45 MG/M2. First dose 12/30/2016.status post 7 cycles.  Last dose was given 02/10/2017 with partial response.  CURRENT THERAPY: Consolidation immunotherapy with Imfinzi (Durvalumab) 10 mg/KG every 2 weeks, first dose April 08, 2017.  Status post 3 cycles.    He received his second cycle in [place].  INTERVAL HISTORY: Harry Allen 76 y.o. male returns for routine follow-up visit accompanied by his wife.  The patient is feeling fine today except for an ongoing cough.  He was recently treated for bronchitis by his primary care provider.  Symptoms are improved.  He denies fevers and chills.  Denies chest pain, shortness of breath, hemoptysis.  Denies nausea, vomiting, constipation, diarrhea.  Denies skin rashes.  Patient is here for evaluation prior to cycle #4 of his immunotherapy.  MEDICAL HISTORY: Past Medical History:  Diagnosis Date  . Adenocarcinoma of right lung, stage 3 ([facility]) 12/19/2016  . Cancer ([facility])    lung cancer  . COPD (chronic obstructive pulmonary disease) ([facility])   . H/O hernia repair   . Sleep apnea     ALLERGIES:  is allergic to no known allergies.  MEDICATIONS:  Current Outpatient Medications  Medication Sig Dispense Refill  . aspirin EC 81 MG tablet Take by mouth.    . Tiotropium Bromide Monohydrate (SPIRIVA RESPIMAT) 2.5 MCG/ACT AERS Inhale 2 puffs into the lungs daily. 1 Inhaler 3  . clotrimazole-betamethasone (LOTRISONE) cream Apply 1 application  topically 2 (two) times daily as needed. 45 g 1  . ipratropium-albuterol (DUONEB) 0.5-2.5 (3) MG/3ML SOLN Take 3 mLs by nebulization every 6 (six) hours as needed. And as needed (Patient not taking: Reported on 05/16/2017) 360 mL 5   No current facility-administered medications for this visit.     SURGICAL HISTORY:  Past Surgical History:  Procedure Laterality Date  . APPENDECTOMY    . HERNIA REPAIR    . HYDROCELE EXCISION     on neck; 1968  . VIDEO BRONCHOSCOPY WITH ENDOBRONCHIAL ULTRASOUND N/A 12/09/2016   Procedure: VIDEO BRONCHOSCOPY WITH ENDOBRONCHIAL ULTRASOUND;  Surgeon: Hendrickson, Steven C, MD;  Location: [facility] OR;  Service: Thoracic;  Laterality: N/A;    REVIEW OF SYSTEMS:   Review of Systems  Constitutional: Negative for appetite change, chills, fatigue, fever and unexpected weight change.  HENT:   Negative for mouth sores, nosebleeds, sore throat and trouble swallowing.   Eyes: Negative for eye problems and icterus.  Respiratory: Negative for hemoptysis, shortness of breath and wheezing.  Positive for cough.  Cardiovascular: Negative for chest pain and leg swelling.  Gastrointestinal: Negative for abdominal pain, constipation, diarrhea, nausea and vomiting.  Genitourinary: Negative for bladder incontinence, difficulty urinating, dysuria, frequency and hematuria.   Musculoskeletal: Negative for back pain, gait problem, neck pain and neck stiffness.  Skin: Negative for itching and rash.  Neurological: Negative for dizziness, extremity weakness, gait problem, headaches, light-headedness and seizures.  Hematological: Negative for adenopathy. Does not bruise/bleed easily.  Psychiatric/Behavioral: Negative for confusion, depression and sleep disturbance. The patient is not nervous/anxious.     PHYSICAL   EXAMINATION:  Blood pressure 111/64, pulse 77, temperature (!) 97.5 F (36.4 C), temperature source Oral, resp. rate 16, weight 147 lb 6.4 oz (66.9 kg), SpO2 95 %.  ECOG  PERFORMANCE STATUS: 1 - Symptomatic but completely ambulatory  Physical Exam  Constitutional: Oriented to person, place, and time and well-developed, well-nourished, and in no distress. No distress.  HENT:  Head: Normocephalic and atraumatic.  Mouth/Throat: Oropharynx is clear and moist. No oropharyngeal exudate.  Eyes: Conjunctivae are normal. Right eye exhibits no discharge. Left eye exhibits no discharge. No scleral icterus.  Neck: Normal range of motion. Neck supple.  Cardiovascular: Normal rate, regular rhythm, normal heart sounds and intact distal pulses.   Pulmonary/Chest: Effort normal and breath sounds normal. No respiratory distress. No wheezes. No rales.  Abdominal: Soft. Bowel sounds are normal. Exhibits no distension and no mass. There is no tenderness.  Musculoskeletal: Normal range of motion. Exhibits no edema.  Lymphadenopathy:    No cervical adenopathy.  Neurological: Alert and oriented to person, place, and time. Exhibits normal muscle tone. Gait normal. Coordination normal.  Skin: Skin is warm and dry. No rash noted. Not diaphoretic. No erythema. No pallor.  Psychiatric: Mood, memory and judgment normal.  Vitals reviewed.  LABORATORY DATA: Lab Results  Component Value Date   WBC 8.6 05/16/2017   HGB 12.2 (L) 05/16/2017   HCT 36.9 (L) 05/16/2017   MCV 94.7 05/16/2017   PLT 320 05/16/2017      Chemistry      Component Value Date/Time   NA 137 05/16/2017 1340   K 4.2 05/16/2017 1340   CL 102 12/04/2016 1553   CO2 26 05/16/2017 1340   BUN 10.9 05/16/2017 1340   CREATININE 0.8 05/16/2017 1340      Component Value Date/Time   CALCIUM 9.3 05/16/2017 1340   ALKPHOS 96 05/16/2017 1340   AST 19 05/16/2017 1340   ALT 12 05/16/2017 1340   BILITOT 0.30 05/16/2017 1340       RADIOGRAPHIC STUDIES:  No results found.   ASSESSMENT/PLAN:  Adenocarcinoma of right lung, stage 3 ([facility]) This is a very pleasant 76 year old African-American male recently diagnosed  with a stage IIIA (T4, N1, M0) non-small cell lung cancer, favoring adenocarcinoma diagnosed in July 2018. The patient underwent a course of concurrent chemoradiation with weekly carboplatin and paclitaxel status post 7 cycles and tolerated this treatment fairly well.  He has partial response to this treatment. He is currently undergoing consolidation treatment with immunotherapy with Imfinzi (Durvalumab) status post 3 cycles.  He received some of his treatment in [place]. We will proceed with cycle #4 next week as scheduled.  The patient will be in [place] for the month of January and will receive 2 cycles of his Imfinzi while out there.  He is scheduled to return back to us in February for evaluation prior to cycle #7.  I have ordered a restaging CT scan of the chest prior to that visit.  For his cough, I recommend that he use Delsym 5 cc twice a day as needed.  He was advised to call immediately if he has any concerning symptoms in the interval. All questions were answered. The patient knows to call the clinic with any problems, questions or concerns. We can certainly see the patient much sooner if necessary.  Orders Placed This Encounter  Procedures  . CT CHEST W CONTRAST    Standing Status:   Future    Standing Expiration Date:   05/16/2018      Order Specific Question:   If indicated for the ordered procedure, I authorize the administration of contrast media per Radiology protocol    Answer:   Yes    Order Specific Question:   Preferred imaging location?    Answer:   [facility]    Order Specific Question:   Radiology Contrast Protocol - do NOT remove file path    Answer:   [email or web address]    Order Specific Question:   Reason for Exam additional comments    Answer:   lung cancer. restaging.     , DNP, AGPCNP-BC, AOCNP 05/16/17    

## 2017-05-16 NOTE — Telephone Encounter (Signed)
No additional appts to add per 12/28 los - central radiology with main hospital to schedule appt for ct.

## 2017-05-16 NOTE — Patient Instructions (Signed)
You may use Delsym 1 tsp. twice a day if needed for cough.

## 2017-05-21 ENCOUNTER — Ambulatory Visit: Payer: Medicare Other

## 2017-05-21 ENCOUNTER — Ambulatory Visit (HOSPITAL_BASED_OUTPATIENT_CLINIC_OR_DEPARTMENT_OTHER): Payer: Medicare Other

## 2017-05-21 ENCOUNTER — Ambulatory Visit: Payer: Medicare Other | Admitting: Internal Medicine

## 2017-05-21 ENCOUNTER — Other Ambulatory Visit: Payer: Medicare Other

## 2017-05-21 VITALS — BP 105/64 | HR 72 | Temp 97.7°F | Resp 17 | Wt 146.4 lb

## 2017-05-21 DIAGNOSIS — Z5112 Encounter for antineoplastic immunotherapy: Secondary | ICD-10-CM | POA: Diagnosis present

## 2017-05-21 DIAGNOSIS — C3431 Malignant neoplasm of lower lobe, right bronchus or lung: Secondary | ICD-10-CM | POA: Diagnosis not present

## 2017-05-21 DIAGNOSIS — C3491 Malignant neoplasm of unspecified part of right bronchus or lung: Secondary | ICD-10-CM

## 2017-05-21 MED ORDER — SODIUM CHLORIDE 0.9 % IV SOLN
Freq: Once | INTRAVENOUS | Status: AC
  Administered 2017-05-21: 16:00:00 via INTRAVENOUS

## 2017-05-21 MED ORDER — SODIUM CHLORIDE 0.9 % IV SOLN
9.2000 mg/kg | Freq: Once | INTRAVENOUS | Status: AC
  Administered 2017-05-21: 620 mg via INTRAVENOUS
  Filled 2017-05-21: qty 10

## 2017-05-21 NOTE — Patient Instructions (Signed)
[facility] Discharge Instructions for Patients Receiving Chemotherapy  Today you received the following chemotherapy agents imfinzi   To help prevent nausea and vomiting after your treatment, we encourage you to take your nausea medication as directed. If you develop nausea and vomiting that is not controlled by your nausea medication, call the clinic.   BELOW ARE SYMPTOMS THAT SHOULD BE REPORTED IMMEDIATELY:  *FEVER GREATER THAN 100.5 F  *CHILLS WITH OR WITHOUT FEVER  NAUSEA AND VOMITING THAT IS NOT CONTROLLED WITH YOUR NAUSEA MEDICATION  *UNUSUAL SHORTNESS OF BREATH  *UNUSUAL BRUISING OR BLEEDING  TENDERNESS IN MOUTH AND THROAT WITH OR WITHOUT PRESENCE OF ULCERS  *URINARY PROBLEMS  *BOWEL PROBLEMS  UNUSUAL RASH Items with * indicate a potential emergency and should be followed up as soon as possible.  Feel free to call the clinic you have any questions or concerns. The clinic phone number is [phone].

## 2017-05-22 DIAGNOSIS — Z23 Encounter for immunization: Secondary | ICD-10-CM | POA: Diagnosis not present

## 2017-06-02 DIAGNOSIS — C3481 Malignant neoplasm of overlapping sites of right bronchus and lung: Secondary | ICD-10-CM | POA: Diagnosis not present

## 2017-06-03 ENCOUNTER — Other Ambulatory Visit: Payer: Medicare Other

## 2017-06-03 ENCOUNTER — Ambulatory Visit: Payer: Medicare Other

## 2017-06-03 ENCOUNTER — Ambulatory Visit: Payer: Medicare Other | Admitting: Internal Medicine

## 2017-06-04 ENCOUNTER — Telehealth: Payer: Self-pay | Admitting: Medical Oncology

## 2017-06-04 DIAGNOSIS — C3490 Malignant neoplasm of unspecified part of unspecified bronchus or lung: Secondary | ICD-10-CM | POA: Diagnosis not present

## 2017-06-04 DIAGNOSIS — C3491 Malignant neoplasm of unspecified part of right bronchus or lung: Secondary | ICD-10-CM | POA: Diagnosis not present

## 2017-06-04 DIAGNOSIS — Z5111 Encounter for antineoplastic chemotherapy: Secondary | ICD-10-CM | POA: Diagnosis not present

## 2017-06-04 DIAGNOSIS — Z79899 Other long term (current) drug therapy: Secondary | ICD-10-CM | POA: Diagnosis not present

## 2017-06-04 DIAGNOSIS — E039 Hypothyroidism, unspecified: Secondary | ICD-10-CM | POA: Diagnosis not present

## 2017-06-04 NOTE — Telephone Encounter (Signed)
Faxed labs and last office note to Dr Prince Rome.

## 2017-06-18 DIAGNOSIS — C3491 Malignant neoplasm of unspecified part of right bronchus or lung: Secondary | ICD-10-CM | POA: Diagnosis not present

## 2017-06-18 DIAGNOSIS — Z5111 Encounter for antineoplastic chemotherapy: Secondary | ICD-10-CM | POA: Diagnosis not present

## 2017-06-18 DIAGNOSIS — C3481 Malignant neoplasm of overlapping sites of right bronchus and lung: Secondary | ICD-10-CM | POA: Diagnosis not present

## 2017-06-18 DIAGNOSIS — L409 Psoriasis, unspecified: Secondary | ICD-10-CM | POA: Diagnosis not present

## 2017-06-19 ENCOUNTER — Telehealth: Payer: Self-pay | Admitting: Internal Medicine

## 2017-06-19 NOTE — Telephone Encounter (Signed)
R/s lab appt per 1/30 sch message - left message with appt date and time.

## 2017-06-30 ENCOUNTER — Inpatient Hospital Stay: Payer: Medicare Other | Attending: Internal Medicine

## 2017-06-30 ENCOUNTER — Encounter (HOSPITAL_COMMUNITY): Payer: Self-pay

## 2017-06-30 ENCOUNTER — Ambulatory Visit (HOSPITAL_COMMUNITY)
Admission: RE | Admit: 2017-06-30 | Discharge: 2017-06-30 | Disposition: A | Discharge status: Home / Self Care | Payer: Medicare Other | Source: Ambulatory Visit | Attending: Oncology | Admitting: Oncology

## 2017-06-30 DIAGNOSIS — I700 Atherosclerosis of aorta: Secondary | ICD-10-CM | POA: Insufficient documentation

## 2017-06-30 DIAGNOSIS — C3491 Malignant neoplasm of unspecified part of right bronchus or lung: Secondary | ICD-10-CM | POA: Insufficient documentation

## 2017-06-30 DIAGNOSIS — J439 Emphysema, unspecified: Secondary | ICD-10-CM | POA: Insufficient documentation

## 2017-06-30 DIAGNOSIS — R21 Rash and other nonspecific skin eruption: Secondary | ICD-10-CM | POA: Diagnosis not present

## 2017-06-30 DIAGNOSIS — Y842 Radiological procedure and radiotherapy as the cause of abnormal reaction of the patient, or of later complication, without mention of misadventure at the time of the procedure: Secondary | ICD-10-CM | POA: Insufficient documentation

## 2017-06-30 DIAGNOSIS — J700 Acute pulmonary manifestations due to radiation: Secondary | ICD-10-CM | POA: Insufficient documentation

## 2017-06-30 DIAGNOSIS — R5382 Chronic fatigue, unspecified: Secondary | ICD-10-CM

## 2017-06-30 DIAGNOSIS — C3431 Malignant neoplasm of lower lobe, right bronchus or lung: Secondary | ICD-10-CM | POA: Insufficient documentation

## 2017-06-30 DIAGNOSIS — Z79899 Other long term (current) drug therapy: Secondary | ICD-10-CM | POA: Insufficient documentation

## 2017-06-30 DIAGNOSIS — I2510 Atherosclerotic heart disease of native coronary artery without angina pectoris: Secondary | ICD-10-CM | POA: Insufficient documentation

## 2017-06-30 LAB — COMPREHENSIVE METABOLIC PANEL
ALT: 14 U/L (ref 0–55)
AST: 26 U/L (ref 5–34)
Albumin: 3.4 g/dL — ABNORMAL LOW (ref 3.5–5.0)
Alkaline Phosphatase: 89 U/L (ref 40–150)
Anion gap: 7 (ref 3–11)
BUN: 11 mg/dL (ref 7–26)
CHLORIDE: 103 mmol/L (ref 98–109)
CO2: 25 mmol/L (ref 22–29)
CREATININE: 0.89 mg/dL (ref 0.70–1.30)
Calcium: 9.4 mg/dL (ref 8.4–10.4)
GFR calc non Af Amer: >60 mL/min (ref >60)
Glucose, Bld: 140 mg/dL (ref 70–140)
Potassium: 4.3 mmol/L (ref 3.5–5.1)
Sodium: 135 mmol/L — ABNORMAL LOW (ref 136–145)
Total Bilirubin: 0.4 mg/dL (ref 0.2–1.2)
Total Protein: 8.4 g/dL — ABNORMAL HIGH (ref 6.4–8.3)

## 2017-06-30 LAB — CBC WITH DIFFERENTIAL/PLATELET
Basophils Absolute: 0 10*3/uL (ref 0.0–0.1)
Basophils Relative: 0 %
EOS ABS: 0.3 10*3/uL (ref 0.0–0.5)
Eosinophils Relative: 4 %
HEMATOCRIT: 40.7 % (ref 38.4–49.9)
HEMOGLOBIN: 13.3 g/dL (ref 13.0–17.1)
Lymphocytes Relative: 10 %
Lymphs Abs: 0.7 10*3/uL — ABNORMAL LOW (ref 0.9–3.3)
MCH: 30.5 pg (ref 27.2–33.4)
MCHC: 32.7 g/dL (ref 32.0–36.0)
MCV: 93.4 fL (ref 79.3–98.0)
MONOS PCT: 11 %
Monocytes Absolute: 0.9 10*3/uL (ref 0.1–0.9)
NEUTROS PCT: 75 %
Neutro Abs: 6 10*3/uL (ref 1.5–6.5)
Platelets: 323 10*3/uL (ref 140–400)
RBC: 4.36 MIL/uL (ref 4.20–5.82)
RDW: 15.3 % — ABNORMAL HIGH (ref 11.0–14.6)
WBC: 7.9 10*3/uL (ref 4.0–10.3)

## 2017-06-30 LAB — TSH: TSH: 1.092 u[IU]/mL (ref 0.320–4.118)

## 2017-06-30 MED ORDER — IOPAMIDOL (ISOVUE-300) INJECTION 61%
INTRAVENOUS | Status: AC
  Administered 2017-06-30: 75 mL via INTRAVENOUS
  Filled 2017-06-30: qty 75

## 2017-06-30 MED ORDER — IOPAMIDOL (ISOVUE-300) INJECTION 61%
75.0000 mL | Freq: Once | INTRAVENOUS | Status: AC | PRN
  Administered 2017-06-30: 75 mL via INTRAVENOUS

## 2017-06-30 MED ORDER — SODIUM CHLORIDE 0.9 % IJ SOLN
INTRAMUSCULAR | Status: AC
  Filled 2017-06-30: qty 50

## 2017-07-01 ENCOUNTER — Telehealth: Payer: Self-pay

## 2017-07-01 ENCOUNTER — Encounter: Payer: Self-pay | Admitting: Internal Medicine

## 2017-07-01 ENCOUNTER — Inpatient Hospital Stay (HOSPITAL_BASED_OUTPATIENT_CLINIC_OR_DEPARTMENT_OTHER): Payer: Medicare Other | Admitting: Internal Medicine

## 2017-07-01 ENCOUNTER — Other Ambulatory Visit: Payer: Medicare Other

## 2017-07-01 ENCOUNTER — Ambulatory Visit: Payer: Medicare Other

## 2017-07-01 VITALS — BP 118/63 | HR 101 | Temp 97.6°F | Resp 16 | Ht 72.0 in | Wt 152.1 lb

## 2017-07-01 DIAGNOSIS — Y842 Radiological procedure and radiotherapy as the cause of abnormal reaction of the patient, or of later complication, without mention of misadventure at the time of the procedure: Secondary | ICD-10-CM | POA: Diagnosis not present

## 2017-07-01 DIAGNOSIS — Z79899 Other long term (current) drug therapy: Secondary | ICD-10-CM | POA: Diagnosis not present

## 2017-07-01 DIAGNOSIS — C3431 Malignant neoplasm of lower lobe, right bronchus or lung: Secondary | ICD-10-CM | POA: Diagnosis not present

## 2017-07-01 DIAGNOSIS — J700 Acute pulmonary manifestations due to radiation: Secondary | ICD-10-CM | POA: Diagnosis not present

## 2017-07-01 DIAGNOSIS — R918 Other nonspecific abnormal finding of lung field: Secondary | ICD-10-CM

## 2017-07-01 DIAGNOSIS — C3491 Malignant neoplasm of unspecified part of right bronchus or lung: Secondary | ICD-10-CM

## 2017-07-01 DIAGNOSIS — R21 Rash and other nonspecific skin eruption: Secondary | ICD-10-CM

## 2017-07-01 MED ORDER — PREDNISONE 10 MG PO TABS: ORAL_TABLET | ORAL | 0 refills | Status: DC

## 2017-07-01 NOTE — Telephone Encounter (Signed)
Printed avs and calender for upcoming appointments. Per 2/12 los

## 2017-07-01 NOTE — Progress Notes (Signed)
[facility] [phone]   [phone]  OFFICE PROGRESS NOTE  [name], MD [place] [address]  DIAGNOSIS: Stage IIIA (T4, N1, M0) non-small cell lung cancer, favoring adenocarcinoma presented with large right lower lobe lung mass in addition to right hilar lymphadenopathy and right upper lobe apical pulmonary nodule diagnosed in July 2018.  Guardant 360 testing: negative EGFR, ALK, ROS 1 and BRAF mutations.  PRIOR THERAPY: Concurrent chemoradiation with weekly carboplatin for AUC of 2 and paclitaxel 45 MG/M2. First dose 12/30/2016.status post 7 cycles.  Last dose was given 02/10/2017 with partial response.  CURRENT THERAPY: Consolidation immunotherapy with Imfinzi (Durvalumab) 10 mg/KG every 2 weeks, first dose April 08, 2017.  Status post 6 cycles.  He received cycle 3 of this treatment in [place].  INTERVAL HISTORY: Harry Allen 77 y.o. male returns to the clinic today for follow-up visit accompanied by his wife.  The patient has been doing fine with no specific complaints except for significant skin rash develop in the abdomen as well as back a few weeks ago.  It has been getting worse.  He has been applying hydrocortisone cream with no improvement.  He also has itching in this area.  The patient denied having any current chest pain, shortness of breath, cough or hemoptysis.  He denied having any weight loss or night sweats.  He has no nausea, vomiting, diarrhea or constipation.  He had repeat CT scan of the chest performed recently and he is here for evaluation and discussion of his discuss results.  MEDICAL HISTORY: Past Medical History:  Diagnosis Date  . Adenocarcinoma of right lung, stage 3 ([facility]) 12/19/2016  . Cancer ([facility])    lung cancer  . COPD (chronic obstructive pulmonary disease) ([facility])   . H/O hernia repair   . Sleep apnea     ALLERGIES:  is allergic to no known allergies.  MEDICATIONS:  Current  Outpatient Medications  Medication Sig Dispense Refill  . aspirin EC 81 MG tablet Take by mouth.    . clotrimazole-betamethasone (LOTRISONE) cream Apply 1 application topically 2 (two) times daily as needed. 45 g 1  . ipratropium-albuterol (DUONEB) 0.5-2.5 (3) MG/3ML SOLN Take 3 mLs by nebulization every 6 (six) hours as needed. And as needed (Patient not taking: Reported on 05/16/2017) 360 mL 5  . Tiotropium Bromide Monohydrate (SPIRIVA RESPIMAT) 2.5 MCG/ACT AERS Inhale 2 puffs into the lungs daily. 1 Inhaler 3   No current facility-administered medications for this visit.     SURGICAL HISTORY:  Past Surgical History:  Procedure Laterality Date  . APPENDECTOMY    . HERNIA REPAIR    . HYDROCELE EXCISION     on neck; 1968  . VIDEO BRONCHOSCOPY WITH ENDOBRONCHIAL ULTRASOUND N/A 12/09/2016   Procedure: VIDEO BRONCHOSCOPY WITH ENDOBRONCHIAL ULTRASOUND;  Surgeon: [name], MD;  Location: [facility];  Service: Thoracic;  Laterality: N/A;    REVIEW OF SYSTEMS:  Constitutional: negative Eyes: negative Ears, nose, mouth, throat, and face: negative Respiratory: negative Cardiovascular: negative Gastrointestinal: negative Genitourinary:negative Integument/breast: positive for pruritus and rash Hematologic/lymphatic: negative Musculoskeletal:negative Neurological: negative Behavioral/Psych: negative Endocrine: negative Allergic/Immunologic: negative   PHYSICAL EXAMINATION: General appearance: alert, cooperative and no distress Head: Normocephalic, without obvious abnormality, atraumatic Neck: no adenopathy, no JVD, supple, symmetrical, trachea midline and thyroid not enlarged, symmetric, no tenderness/mass/nodules Lymph nodes: Cervical, supraclavicular, and axillary nodes normal. Resp: clear to auscultation bilaterally Back: symmetric, no curvature. ROM normal. No CVA tenderness.  Cardio: regular rate and rhythm, S1, S2 normal, no murmur, click, rub or gallop GI: soft,  non-tender; bowel sounds normal; no masses,  no organomegaly Extremities: extremities normal, atraumatic, no cyanosis or edema Neurologic: Alert and oriented X 3, normal strength and tone. Normal symmetric reflexes. Normal coordination and gait        ECOG PERFORMANCE STATUS: 1 - Symptomatic but completely ambulatory  Blood pressure 118/63, pulse (!) 101, temperature 97.6 F (36.4 C), temperature source Oral, resp. rate 16, height 6' (1.829 m), weight 152 lb 1.6 oz (69 kg), SpO2 96 %.  LABORATORY DATA: Lab Results  Component Value Date   WBC 7.9 06/30/2017   HGB 13.3 06/30/2017   HCT 40.7 06/30/2017   MCV 93.4 06/30/2017   PLT 323 06/30/2017      Chemistry      Component Value Date/Time   NA 135 (L) 06/30/2017 1332   NA 137 05/16/2017 1340   K 4.3 06/30/2017 1332   K 4.2 05/16/2017 1340   CL 103 06/30/2017 1332   CO2 25 06/30/2017 1332   CO2 26 05/16/2017 1340   BUN 11 06/30/2017 1332   BUN 10.9 05/16/2017 1340   CREATININE 0.89 06/30/2017 1332   CREATININE 0.8 05/16/2017 1340      Component Value Date/Time   CALCIUM 9.4 06/30/2017 1332   CALCIUM 9.3 05/16/2017 1340   ALKPHOS 89 06/30/2017 1332   ALKPHOS 96 05/16/2017 1340   AST 26 06/30/2017 1332   AST 19 05/16/2017 1340   ALT 14 06/30/2017 1332   ALT 12 05/16/2017 1340   BILITOT 0.4 06/30/2017 1332   BILITOT 0.30 05/16/2017 1340       RADIOGRAPHIC STUDIES: Ct Chest W Contrast  Result Date: 06/30/2017 CLINICAL DATA:  Followup right lung adenocarcinoma. Previous radiation therapy and chemotherapy. Currently undergoing immunotherapy. EXAM: CT CHEST WITH CONTRAST TECHNIQUE: Multidetector CT imaging of the chest was performed during intravenous contrast administration. CONTRAST:  75 mL Isovue 300 COMPARISON:  03/12/2017 FINDINGS: Cardiovascular: No acute findings. Aortic and coronary artery atherosclerosis. Mediastinum/Nodes: No masses or pathologically enlarged lymph nodes identified. Lungs/Pleura: Increased  airspace opacity is seen within the right paramediastinal lung zone with central bronchiectasis, consistent with radiation pneumonitis. Rounded area of opacity in the central right lower lobe has decreased in size, currently measuring 2.2 x 2.0 cm on image 80/7, compared to 4.0 x 2.9 cm previously. Previously noted nodule in the right lung apex is obscured by increased airspace disease since prior exam. No new or enlarging the pulmonary nodules or masses are identified. Moderate emphysema is again demonstrated. New tiny right posterior pleural effusion noted. Upper Abdomen:  Unremarkable. Musculoskeletal:  No suspicious bone lesions. 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 Decreased size of central right lower lobe masslike opacity. No evidence of new or progressive metastatic disease within the thorax. Aortic Atherosclerosis (ICD10-I70.0) and Emphysema (ICD10-J43.9). Coronary artery calcification. Electronically Signed   By: Earle Gell M.D.   On: 06/30/2017 15:39    ASSESSMENT AND PLAN: This is a very pleasant 77 years old African-American male recently diagnosed with a stage IIIA (T4, N1, M0) non-small cell lung cancer, favoring adenocarcinoma diagnosed in July 2018. The patient underwent a course of concurrent chemoradiation with weekly carboplatin and paclitaxel status post 7 cycles and tolerated this treatment fairly well.  He has partial response to this treatment. He is currently undergoing consolidation treatment with immunotherapy with Imfinzi (Durvalumab) status post 6 cycles.  He received some of his treatment in  [place]. The patient has been tolerating this treatment well except for the significant skin rash on the abdomen and back as well as a few areas in the lower extremities. The patient also had repeat CT scan of the chest performed recently that showed further improvement in the central right lower lobe masslike opacity in addition to increased  radiation pneumonitis. I had a lengthy discussion with the patient and his wife about his condition.  I recommended for him to hold his treatment for now because of the significant skin rash. For the immunotherapy induced dermatitis, I will start the patient on a taper dose of prednisone starting at 1 mg/KG daily for 7 days and this will be tapered slowly over the next 6 weeks. I will see him back for follow-up visit in 2-3 weeks for reevaluation and management of any adverse effect of the prednisone. He was also advised to take over-the-counter omeprazole or Pepcid for GI prophylaxis. The patient was also advised about the adverse effect of prednisone including hyperglycemia and insomnia. He was advised to call immediately if he has any concerning symptoms in the interval. The patient voices understanding of current disease status and treatment options and is in agreement with the current care plan. All questions were answered. The patient knows to call the clinic with any problems, questions or concerns. We can certainly see the patient much sooner if necessary.  Disclaimer: This note was dictated with voice recognition software. Similar sounding words can inadvertently be transcribed and may not be corrected upon review.

## 2017-07-03 ENCOUNTER — Ambulatory Visit: Payer: Medicare Other

## 2017-07-15 ENCOUNTER — Encounter: Payer: Self-pay | Admitting: Internal Medicine

## 2017-07-15 ENCOUNTER — Inpatient Hospital Stay (HOSPITAL_BASED_OUTPATIENT_CLINIC_OR_DEPARTMENT_OTHER): Payer: Medicare Other | Admitting: Internal Medicine

## 2017-07-15 ENCOUNTER — Inpatient Hospital Stay: Payer: Medicare Other

## 2017-07-15 ENCOUNTER — Telehealth: Payer: Self-pay | Admitting: Internal Medicine

## 2017-07-15 VITALS — BP 123/68 | HR 72 | Temp 97.6°F | Resp 17 | Ht 72.0 in | Wt 153.5 lb

## 2017-07-15 DIAGNOSIS — Y842 Radiological procedure and radiotherapy as the cause of abnormal reaction of the patient, or of later complication, without mention of misadventure at the time of the procedure: Secondary | ICD-10-CM

## 2017-07-15 DIAGNOSIS — C3431 Malignant neoplasm of lower lobe, right bronchus or lung: Secondary | ICD-10-CM | POA: Diagnosis not present

## 2017-07-15 DIAGNOSIS — C3491 Malignant neoplasm of unspecified part of right bronchus or lung: Secondary | ICD-10-CM

## 2017-07-15 DIAGNOSIS — R21 Rash and other nonspecific skin eruption: Secondary | ICD-10-CM | POA: Diagnosis not present

## 2017-07-15 DIAGNOSIS — J700 Acute pulmonary manifestations due to radiation: Secondary | ICD-10-CM

## 2017-07-15 DIAGNOSIS — Z5112 Encounter for antineoplastic immunotherapy: Secondary | ICD-10-CM

## 2017-07-15 DIAGNOSIS — Z79899 Other long term (current) drug therapy: Secondary | ICD-10-CM | POA: Diagnosis not present

## 2017-07-15 LAB — CMP (CANCER CENTER ONLY)
ALK PHOS: 70 U/L (ref 40–150)
ALT: 14 U/L (ref 0–55)
ANION GAP: 11 (ref 3–11)
AST: 17 U/L (ref 5–34)
Albumin: 3.2 g/dL — ABNORMAL LOW (ref 3.5–5.0)
BUN: 14 mg/dL (ref 7–26)
CO2: 25 mmol/L (ref 22–29)
Calcium: 9.3 mg/dL (ref 8.4–10.4)
Chloride: 99 mmol/L (ref 98–109)
Creatinine: 0.87 mg/dL (ref 0.70–1.30)
GFR, Estimated: >60 mL/min (ref >60)
Glucose, Bld: 86 mg/dL (ref 70–140)
Potassium: 3.9 mmol/L (ref 3.5–5.1)
SODIUM: 135 mmol/L — AB (ref 136–145)
TOTAL PROTEIN: 7.9 g/dL (ref 6.4–8.3)
Total Bilirubin: 0.4 mg/dL (ref 0.2–1.2)

## 2017-07-15 LAB — CBC WITH DIFFERENTIAL (CANCER CENTER ONLY)
Basophils Absolute: 0 10*3/uL (ref 0.0–0.1)
Basophils Relative: 0 %
EOS ABS: 0.2 10*3/uL (ref 0.0–0.5)
EOS PCT: 2 %
HCT: 41.7 % (ref 38.4–49.9)
HEMOGLOBIN: 14 g/dL (ref 13.0–17.1)
LYMPHS ABS: 1.7 10*3/uL (ref 0.9–3.3)
Lymphocytes Relative: 17 %
MCH: 30.8 pg (ref 27.2–33.4)
MCHC: 33.6 g/dL (ref 32.0–36.0)
MCV: 91.9 fL (ref 79.3–98.0)
MONOS PCT: 12 %
Monocytes Absolute: 1.2 10*3/uL — ABNORMAL HIGH (ref 0.1–0.9)
Neutro Abs: 6.9 10*3/uL — ABNORMAL HIGH (ref 1.5–6.5)
Neutrophils Relative %: 69 %
PLATELETS: 251 10*3/uL (ref 140–400)
RBC: 4.54 MIL/uL (ref 4.20–5.82)
RDW: 14.8 % — ABNORMAL HIGH (ref 11.0–14.6)
WBC Count: 10 10*3/uL (ref 4.0–10.3)

## 2017-07-15 NOTE — Progress Notes (Signed)
[facility] [phone]   [phone]  OFFICE PROGRESS NOTE  [name], MD [place] [address]  DIAGNOSIS: Stage IIIA (T4, N1, M0) non-small cell lung cancer, favoring adenocarcinoma presented with large right lower lobe lung mass in addition to right hilar lymphadenopathy and right upper lobe apical pulmonary nodule diagnosed in July 2018.  Guardant 360 testing: negative EGFR, ALK, ROS 1 and BRAF mutations.  PRIOR THERAPY: Concurrent chemoradiation with weekly carboplatin for AUC of 2 and paclitaxel 45 MG/M2. First dose 12/30/2016.status post 7 cycles.  Last dose was given 02/10/2017 with partial response.  CURRENT THERAPY: Consolidation immunotherapy with Imfinzi (Durvalumab) 10 mg/KG every 2 weeks, first dose April 08, 2017.  Status post 6 cycles.  He received cycle 3 of this treatment in [place].  His treatment is currently on hold secondary to immunotherapy mediated skin rash.  INTERVAL HISTORY: Harry Allen 77 y.o. male returns to the clinic today for follow-up visit accompanied by his wife.  The patient is feeling much better and he has significant improvement in the skin rash after starting the high-dose prednisone.  He is currently on 50 mg p.o. Daily.  He also has a good appetite and gained a few pounds.  He denied having any chest pain, shortness of breath, cough or hemoptysis.  He denied having any fever or chills.  He has no nausea, vomiting, diarrhea or constipation.  He is here today for reevaluation and repeat blood work.  MEDICAL HISTORY: Past Medical History:  Diagnosis Date  . Adenocarcinoma of right lung, stage 3 ([facility]) 12/19/2016  . Cancer ([facility])    lung cancer  . COPD (chronic obstructive pulmonary disease) ([facility])   . H/O hernia repair   . Sleep apnea     ALLERGIES:  is allergic to no known allergies.  MEDICATIONS:  Current Outpatient Medications  Medication Sig Dispense Refill  . aspirin EC 81  MG tablet Take by mouth.    . clotrimazole-betamethasone (LOTRISONE) cream Apply 1 application topically 2 (two) times daily as needed. 45 g 1  . ipratropium-albuterol (DUONEB) 0.5-2.5 (3) MG/3ML SOLN Take 3 mLs by nebulization every 6 (six) hours as needed. And as needed (Patient not taking: Reported on 05/16/2017) 360 mL 5  . predniSONE (DELTASONE) 10 MG tablet Taper: Seven tab po qd x 7 days, 6 tab po qd x 7 days, 5 tab po qd x 7 days, 4 tab po qd x 7 days, 3 tab po qd x 7 days, 2 tab po qd x 7 days, one tab po qd x 7 days. 192 tablet 0  . Tiotropium Bromide Monohydrate (SPIRIVA RESPIMAT) 2.5 MCG/ACT AERS Inhale 2 puffs into the lungs daily. 1 Inhaler 3   No current facility-administered medications for this visit.     SURGICAL HISTORY:  Past Surgical History:  Procedure Laterality Date  . APPENDECTOMY    . HERNIA REPAIR    . HYDROCELE EXCISION     on neck; 1968  . VIDEO BRONCHOSCOPY WITH ENDOBRONCHIAL ULTRASOUND N/A 12/09/2016   Procedure: VIDEO BRONCHOSCOPY WITH ENDOBRONCHIAL ULTRASOUND;  Surgeon: [name], MD;  Location: [facility];  Service: Thoracic;  Laterality: N/A;    REVIEW OF SYSTEMS:  A comprehensive review of systems was negative except for: Integument/breast: positive for rash   PHYSICAL EXAMINATION: General appearance: alert, cooperative and no distress Head: Normocephalic, without obvious abnormality, atraumatic Neck: no adenopathy, no JVD, supple, symmetrical, trachea midline and thyroid not enlarged,  symmetric, no tenderness/mass/nodules Lymph nodes: Cervical, supraclavicular, and axillary nodes normal. Resp: clear to auscultation bilaterally Back: symmetric, no curvature. ROM normal. No CVA tenderness. Cardio: regular rate and rhythm, S1, S2 normal, no murmur, click, rub or gallop GI: soft, non-tender; bowel sounds normal; no masses,  no organomegaly Extremities: extremities normal, atraumatic, no cyanosis or edema          ECOG PERFORMANCE STATUS: 1  - Symptomatic but completely ambulatory  Blood pressure 123/68, pulse 72, temperature 97.6 F (36.4 C), temperature source Oral, resp. rate 17, height 6' (1.829 m), weight 153 lb 8 oz (69.6 kg), SpO2 99 %.  LABORATORY DATA: Lab Results  Component Value Date   WBC 10.0 07/15/2017   HGB 13.3 06/30/2017   HCT 41.7 07/15/2017   MCV 91.9 07/15/2017   PLT 251 07/15/2017      Chemistry      Component Value Date/Time   NA 135 (L) 07/15/2017 1448   NA 137 05/16/2017 1340   K 3.9 07/15/2017 1448   K 4.2 05/16/2017 1340   CL 99 07/15/2017 1448   CO2 25 07/15/2017 1448   CO2 26 05/16/2017 1340   BUN 14 07/15/2017 1448   BUN 10.9 05/16/2017 1340   CREATININE 0.87 07/15/2017 1448   CREATININE 0.8 05/16/2017 1340      Component Value Date/Time   CALCIUM 9.3 07/15/2017 1448   CALCIUM 9.3 05/16/2017 1340   ALKPHOS 70 07/15/2017 1448   ALKPHOS 96 05/16/2017 1340   AST 17 07/15/2017 1448   AST 19 05/16/2017 1340   ALT 14 07/15/2017 1448   ALT 12 05/16/2017 1340   BILITOT 0.4 07/15/2017 1448   BILITOT 0.30 05/16/2017 1340       RADIOGRAPHIC STUDIES: Ct Chest W Contrast  Result Date: 06/30/2017 CLINICAL DATA:  Followup right lung adenocarcinoma. Previous radiation therapy and chemotherapy. Currently undergoing immunotherapy. EXAM: CT CHEST WITH CONTRAST TECHNIQUE: Multidetector CT imaging of the chest was performed during intravenous contrast administration. CONTRAST:  75 mL Isovue 300 COMPARISON:  03/12/2017 FINDINGS: Cardiovascular: No acute findings. Aortic and coronary artery atherosclerosis. Mediastinum/Nodes: No masses or pathologically enlarged lymph nodes identified. Lungs/Pleura: Increased airspace opacity is seen within the right paramediastinal lung zone with central bronchiectasis, consistent with radiation pneumonitis. Rounded area of opacity in the central right lower lobe has decreased in size, currently measuring 2.2 x 2.0 cm on image 80/7, compared to 4.0 x 2.9 cm  previously. Previously noted nodule in the right lung apex is obscured by increased airspace disease since prior exam. No new or enlarging the pulmonary nodules or masses are identified. Moderate emphysema is again demonstrated. New tiny right posterior pleural effusion noted. Upper Abdomen:  Unremarkable. Musculoskeletal:  No suspicious bone lesions. 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 Decreased size of central right lower lobe masslike opacity. No evidence of new or progressive metastatic disease within the thorax. Aortic Atherosclerosis (ICD10-I70.0) and Emphysema (ICD10-J43.9). Coronary artery calcification. Electronically Signed   By: Earle Gell M.D.   On: 06/30/2017 15:39    ASSESSMENT AND PLAN: This is a very pleasant 77 years old African-American male recently diagnosed with a stage IIIA (T4, N1, M0) non-small cell lung cancer, favoring adenocarcinoma diagnosed in July 2018. The patient underwent a course of concurrent chemoradiation with weekly carboplatin and paclitaxel status post 7 cycles and tolerated this treatment fairly well.  He has partial response to this treatment. He is currently undergoing consolidation treatment with immunotherapy with Imfinzi (Durvalumab) status post 6  cycles.  He received some of his treatment in [place]. The patient has been tolerating this treatment well except for the significant skin rash on the abdomen and back as well as a few areas in the lower extremities. He was a started on high-dose prednisone initially at 70 mg p.o. daily and he is currently on 50 mg p.o. daily.  There is significant improvement in the skin rash and itching. I recommended for the patient to continue on the taper dose of prednisone. He has plans to travel to [place] and will be back in 4 weeks.  I will arrange for the patient a follow-up appointment for him at that time. He was also advised to continue with over-the-counter omeprazole or  Pepcid for GI prophylaxis. The patient was advised to call immediately if he has any concerning symptoms in the interval. The patient voices understanding of current disease status and treatment options and is in agreement with the current care plan. All questions were answered. The patient knows to call the clinic with any problems, questions or concerns. We can certainly see the patient much sooner if necessary.  Disclaimer: This note was dictated with voice recognition software. Similar sounding words can inadvertently be transcribed and may not be corrected upon review.

## 2017-07-15 NOTE — Telephone Encounter (Signed)
Scheduled appt per 2/26 los - patient aware of appt date and time. - no print out wanted.

## 2017-07-17 ENCOUNTER — Telehealth: Payer: Self-pay | Admitting: Internal Medicine

## 2017-07-17 ENCOUNTER — Other Ambulatory Visit: Payer: Self-pay | Admitting: *Deleted

## 2017-07-17 MED ORDER — PREDNISONE 10 MG PO TABS: ORAL_TABLET | ORAL | 0 refills | Status: DC

## 2017-07-17 NOTE — Telephone Encounter (Signed)
Returned call to patients wife requesting to reschedule 4/1 appt.  Appointment was moved per phone message 2/28

## 2017-07-17 NOTE — Progress Notes (Signed)
Pt called states he left his rx in  Per MD, pt will need PCP in [place] to write Rx. Notified pt

## 2017-08-07 ENCOUNTER — Telehealth: Payer: Self-pay | Admitting: Medical Oncology

## 2017-08-07 NOTE — Telephone Encounter (Signed)
Left message for pt to finish prednisone taper, there is no refill.

## 2017-08-12 ENCOUNTER — Telehealth: Payer: Self-pay | Admitting: Medical Oncology

## 2017-08-12 NOTE — Telephone Encounter (Signed)
LVM for pt to take prednisone 1 tab /day for 7 days then stop.

## 2017-08-18 ENCOUNTER — Ambulatory Visit: Payer: Medicare Other | Admitting: Internal Medicine

## 2017-08-18 ENCOUNTER — Other Ambulatory Visit: Payer: Medicare Other

## 2017-08-21 ENCOUNTER — Inpatient Hospital Stay (HOSPITAL_BASED_OUTPATIENT_CLINIC_OR_DEPARTMENT_OTHER): Payer: Medicare Other | Admitting: Internal Medicine

## 2017-08-21 ENCOUNTER — Telehealth: Payer: Self-pay | Admitting: Internal Medicine

## 2017-08-21 ENCOUNTER — Telehealth: Payer: Self-pay | Admitting: *Deleted

## 2017-08-21 ENCOUNTER — Inpatient Hospital Stay: Payer: Medicare Other | Attending: Internal Medicine

## 2017-08-21 ENCOUNTER — Encounter: Payer: Self-pay | Admitting: Internal Medicine

## 2017-08-21 ENCOUNTER — Telehealth: Payer: Self-pay | Admitting: Medical Oncology

## 2017-08-21 VITALS — BP 118/71 | HR 83 | Resp 18 | Ht 72.0 in | Wt 156.3 lb

## 2017-08-21 DIAGNOSIS — Z9221 Personal history of antineoplastic chemotherapy: Secondary | ICD-10-CM

## 2017-08-21 DIAGNOSIS — R5382 Chronic fatigue, unspecified: Secondary | ICD-10-CM

## 2017-08-21 DIAGNOSIS — Z923 Personal history of irradiation: Secondary | ICD-10-CM

## 2017-08-21 DIAGNOSIS — C3431 Malignant neoplasm of lower lobe, right bronchus or lung: Secondary | ICD-10-CM | POA: Diagnosis not present

## 2017-08-21 DIAGNOSIS — C3490 Malignant neoplasm of unspecified part of unspecified bronchus or lung: Secondary | ICD-10-CM

## 2017-08-21 DIAGNOSIS — Z5112 Encounter for antineoplastic immunotherapy: Secondary | ICD-10-CM

## 2017-08-21 DIAGNOSIS — L299 Pruritus, unspecified: Secondary | ICD-10-CM | POA: Insufficient documentation

## 2017-08-21 DIAGNOSIS — R21 Rash and other nonspecific skin eruption: Secondary | ICD-10-CM

## 2017-08-21 DIAGNOSIS — C3491 Malignant neoplasm of unspecified part of right bronchus or lung: Secondary | ICD-10-CM

## 2017-08-21 LAB — CBC WITH DIFFERENTIAL/PLATELET
BASOS PCT: 1 %
Basophils Absolute: 0 10*3/uL (ref 0.0–0.1)
Eosinophils Absolute: 0.1 10*3/uL (ref 0.0–0.5)
Eosinophils Relative: 1 %
HEMATOCRIT: 40.6 % (ref 38.4–49.9)
HEMOGLOBIN: 13.7 g/dL (ref 13.0–17.1)
LYMPHS ABS: 0.8 10*3/uL — AB (ref 0.9–3.3)
Lymphocytes Relative: 12 %
MCH: 31.2 pg (ref 27.2–33.4)
MCHC: 33.7 g/dL (ref 32.0–36.0)
MCV: 92.7 fL (ref 79.3–98.0)
MONO ABS: 0.8 10*3/uL (ref 0.1–0.9)
MONOS PCT: 12 %
NEUTROS ABS: 5.3 10*3/uL (ref 1.5–6.5)
NEUTROS PCT: 74 %
Platelets: 255 10*3/uL (ref 140–400)
RBC: 4.38 MIL/uL (ref 4.20–5.82)
RDW: 15.5 % — ABNORMAL HIGH (ref 11.0–14.6)
WBC: 7.1 10*3/uL (ref 4.0–10.3)

## 2017-08-21 LAB — COMPREHENSIVE METABOLIC PANEL
ALBUMIN: 3.3 g/dL — AB (ref 3.5–5.0)
ALK PHOS: 68 U/L (ref 40–150)
ALT: 21 U/L (ref 0–55)
ANION GAP: 6 (ref 3–11)
AST: 30 U/L (ref 5–34)
BILIRUBIN TOTAL: 0.4 mg/dL (ref 0.2–1.2)
BUN: 11 mg/dL (ref 7–26)
CALCIUM: 9.6 mg/dL (ref 8.4–10.4)
CO2: 29 mmol/L (ref 22–29)
Chloride: 101 mmol/L (ref 98–109)
Creatinine, Ser: 0.81 mg/dL (ref 0.70–1.30)
GLUCOSE: 82 mg/dL (ref 70–140)
POTASSIUM: 3.9 mmol/L (ref 3.5–5.1)
Sodium: 136 mmol/L (ref 136–145)
TOTAL PROTEIN: 7.5 g/dL (ref 6.4–8.3)

## 2017-08-21 LAB — TSH: TSH: 0.928 u[IU]/mL (ref 0.320–4.118)

## 2017-08-21 NOTE — Telephone Encounter (Signed)
Faxed records

## 2017-08-21 NOTE — Telephone Encounter (Signed)
Scheduled appt for Dermatology referral with [facility] on 4/24 @ 8 :44 wit Dr. Cristino Martes. - unable to reach patient left a message on patient spouse's phone with appt date and time. If the appt does not work , I left the number for [facility] for them to call in and our office  Number if patient has any question.

## 2017-08-21 NOTE — Telephone Encounter (Signed)
Received call from pt's wife stating that pt saw Dr Julien Nordmann earlier & that he wanted pt to see a dermatologist.  Pt was unable to get an appt with his dermatologist until date he plans to be out of town.  She called her dermatologist & they are going to work him in on Monday with Jonelle Sidle PA with [facility].  She wants to make sure records are sent there. Message routed to Dr Mohamed/Pod RN.

## 2017-08-21 NOTE — Progress Notes (Signed)
[facility] [phone]   Fax:(336) [phone]  OFFICE PROGRESS NOTE  [name], MD [place] [address]  DIAGNOSIS: Stage IIIA (T4, N1, M0) non-small cell lung cancer, favoring adenocarcinoma presented with large right lower lobe lung mass in addition to right hilar lymphadenopathy and right upper lobe apical pulmonary nodule diagnosed in July 2018.  Guardant 360 testing: negative EGFR, ALK, ROS 1 and BRAF mutations.  PRIOR THERAPY: Concurrent chemoradiation with weekly carboplatin for AUC of 2 and paclitaxel 45 MG/M2. First dose 12/30/2016.status post 7 cycles.  Last dose was given 02/10/2017 with partial response.  CURRENT THERAPY: Consolidation immunotherapy with Imfinzi (Durvalumab) 10 mg/KG every 2 weeks, first dose April 08, 2017.  Status post 6 cycles.  He received cycle 3 of this treatment in [place].  His treatment is currently on hold secondary to immunotherapy mediated skin rash.  INTERVAL HISTORY: Harry Allen 77 y.o. male returns to the clinic today for follow-up visit.  The patient is feeling fine with no specific complaints except for the persistent rash in the abdomen and back.  He completed a taper course of prednisone last week.  He has more itching especially in the back after stopping the prednisone.  He applied hydrocortisone cream to these areas.  The patient denied having any significant weight loss or night sweats.  He denied having any chest pain, shortness breath, cough or hemoptysis.  He denied having any fever or chills.  He has no nausea, vomiting, diarrhea or constipation.  His treatment with immunotherapy has been on hold for several weeks because of the significant skin rash.  MEDICAL HISTORY: Past Medical History:  Diagnosis Date  . Adenocarcinoma of right lung, stage 3 ([place]) 12/19/2016  . Cancer ([place])    lung cancer  . COPD (chronic obstructive pulmonary disease) ([place])   . H/O hernia repair   .  Sleep apnea     ALLERGIES:  is allergic to no known allergies.  MEDICATIONS:  Current Outpatient Medications  Medication Sig Dispense Refill  . aspirin EC 81 MG tablet Take by mouth.    . clotrimazole-betamethasone (LOTRISONE) cream Apply 1 application topically 2 (two) times daily as needed. 45 g 1  . ipratropium-albuterol (DUONEB) 0.5-2.5 (3) MG/3ML SOLN Take 3 mLs by nebulization every 6 (six) hours as needed. And as needed (Patient not taking: Reported on 05/16/2017) 360 mL 5  . predniSONE (DELTASONE) 10 MG tablet Taper: Seven tab po qd x 7 days, 6 tab po qd x 7 days, 5 tab po qd x 7 days, 4 tab po qd x 7 days, 3 tab po qd x 7 days, 2 tab po qd x 7 days, one tab po qd x 7 days. 192 tablet 0  . Tiotropium Bromide Monohydrate (SPIRIVA RESPIMAT) 2.5 MCG/ACT AERS Inhale 2 puffs into the lungs daily. 1 Inhaler 3   No current facility-administered medications for this visit.     SURGICAL HISTORY:  Past Surgical History:  Procedure Laterality Date  . APPENDECTOMY    . HERNIA REPAIR    . HYDROCELE EXCISION     on neck; 1968  . VIDEO BRONCHOSCOPY WITH ENDOBRONCHIAL ULTRASOUND N/A 12/09/2016   Procedure: VIDEO BRONCHOSCOPY WITH ENDOBRONCHIAL ULTRASOUND;  Surgeon: [name], MD;  Location: [facility] OR;  Service: Thoracic;  Laterality: N/A;    REVIEW OF SYSTEMS:  Constitutional: negative Eyes: negative Ears, nose, mouth, throat, and face: negative Respiratory: negative Cardiovascular: negative Gastrointestinal: negative Genitourinary:negative Integument/breast: positive  for pruritus and rash Hematologic/lymphatic: negative Musculoskeletal:negative Neurological: negative Behavioral/Psych: negative Endocrine: negative Allergic/Immunologic: negative   PHYSICAL EXAMINATION: General appearance: alert, cooperative and no distress Head: Normocephalic, without obvious abnormality, atraumatic Neck: no adenopathy, no JVD, supple, symmetrical, trachea midline and thyroid not enlarged,  symmetric, no tenderness/mass/nodules Lymph nodes: Cervical, supraclavicular, and axillary nodes normal. Resp: clear to auscultation bilaterally Back: symmetric, no curvature. ROM normal. No CVA tenderness. Cardio: regular rate and rhythm, S1, S2 normal, no murmur, click, rub or gallop GI: soft, non-tender; bowel sounds normal; no masses,  no organomegaly Extremities: extremities normal, atraumatic, no cyanosis or edema Neurologic: Alert and oriented X 3, normal strength and tone. Normal symmetric reflexes. Normal coordination and gait         ECOG PERFORMANCE STATUS: 1 - Symptomatic but completely ambulatory  Blood pressure 118/71, pulse 83, resp. rate 18, height 6' (1.829 m), weight 156 lb 4.8 oz (70.9 kg), SpO2 97 %.  LABORATORY DATA: Lab Results  Component Value Date   WBC 7.1 08/21/2017   HGB 13.7 08/21/2017   HCT 40.6 08/21/2017   MCV 92.7 08/21/2017   PLT 255 08/21/2017      Chemistry      Component Value Date/Time   NA 135 (L) 07/15/2017 1448   NA 137 05/16/2017 1340   K 3.9 07/15/2017 1448   K 4.2 05/16/2017 1340   CL 99 07/15/2017 1448   CO2 25 07/15/2017 1448   CO2 26 05/16/2017 1340   BUN 14 07/15/2017 1448   BUN 10.9 05/16/2017 1340   CREATININE 0.87 07/15/2017 1448   CREATININE 0.8 05/16/2017 1340      Component Value Date/Time   CALCIUM 9.3 07/15/2017 1448   CALCIUM 9.3 05/16/2017 1340   ALKPHOS 70 07/15/2017 1448   ALKPHOS 96 05/16/2017 1340   AST 17 07/15/2017 1448   AST 19 05/16/2017 1340   ALT 14 07/15/2017 1448   ALT 12 05/16/2017 1340   BILITOT 0.4 07/15/2017 1448   BILITOT 0.30 05/16/2017 1340       RADIOGRAPHIC STUDIES: No results found.  ASSESSMENT AND PLAN: This is a very pleasant 77 years old African-American male recently diagnosed with a stage IIIA (T4, N1, M0) non-small cell lung cancer, favoring adenocarcinoma diagnosed in July 2018. The patient underwent a course of concurrent chemoradiation with weekly carboplatin and  paclitaxel status post 7 cycles and tolerated this treatment fairly well.  He has partial response to this treatment. He is currently undergoing consolidation treatment with immunotherapy with Imfinzi (Durvalumab) status post 6 cycles.  He received some of his treatment in [place]. The patient has been tolerating this treatment well except for the significant skin rash on the abdomen and back as well as a few areas in the lower extremities. He completed a course of taper prednisone starting at 1 mg/KG over the last 7 weeks.  He continues to have significant skin rash with itching. I had a lengthy discussion with the patient today about his condition.  I recommended for him to continue holding his treatment with immunotherapy for now. I will also refer the patient to dermatology for further evaluation and recommendation regarding his persistent skin rash. I will see the patient back for follow-up visit in 6 weeks for reevaluation with repeat CT scan of the chest for restaging of his disease. He was advised to call immediately if he has any concerning symptoms in the interval. The patient voices understanding of current disease status and treatment options and is in agreement with  the current care plan. All questions were answered. The patient knows to call the clinic with any problems, questions or concerns. We can certainly see the patient much sooner if necessary.  Disclaimer: This note was dictated with voice recognition software. Similar sounding words can inadvertently be transcribed and may not be corrected upon review.

## 2017-08-21 NOTE — Telephone Encounter (Signed)
LVM to call back with name of dermatology practice.

## 2017-08-21 NOTE — Telephone Encounter (Signed)
Scheduled appt per 4/4 los - Gave patient AVS and calender per los. - Central radiology to contact patient with ct scan.

## 2017-08-25 DIAGNOSIS — L409 Psoriasis, unspecified: Secondary | ICD-10-CM | POA: Diagnosis not present

## 2017-08-27 ENCOUNTER — Telehealth: Payer: Self-pay | Admitting: Medical Oncology

## 2017-08-27 NOTE — Telephone Encounter (Signed)
scheduled to see Harry Allen 4/22 after Ct scan

## 2017-09-01 DIAGNOSIS — E785 Hyperlipidemia, unspecified: Secondary | ICD-10-CM | POA: Diagnosis not present

## 2017-09-03 DIAGNOSIS — E785 Hyperlipidemia, unspecified: Secondary | ICD-10-CM | POA: Diagnosis not present

## 2017-09-03 DIAGNOSIS — E875 Hyperkalemia: Secondary | ICD-10-CM | POA: Diagnosis not present

## 2017-09-03 DIAGNOSIS — L409 Psoriasis, unspecified: Secondary | ICD-10-CM | POA: Diagnosis not present

## 2017-09-03 DIAGNOSIS — C3492 Malignant neoplasm of unspecified part of left bronchus or lung: Secondary | ICD-10-CM | POA: Diagnosis not present

## 2017-09-04 ENCOUNTER — Ambulatory Visit (HOSPITAL_COMMUNITY)
Admission: RE | Admit: 2017-09-04 | Discharge: 2017-09-04 | Disposition: A | Discharge status: Home / Self Care | Payer: Medicare Other | Source: Ambulatory Visit | Attending: Internal Medicine | Admitting: Internal Medicine

## 2017-09-04 DIAGNOSIS — I2510 Atherosclerotic heart disease of native coronary artery without angina pectoris: Secondary | ICD-10-CM | POA: Insufficient documentation

## 2017-09-04 DIAGNOSIS — I700 Atherosclerosis of aorta: Secondary | ICD-10-CM | POA: Diagnosis not present

## 2017-09-04 DIAGNOSIS — C3490 Malignant neoplasm of unspecified part of unspecified bronchus or lung: Secondary | ICD-10-CM | POA: Insufficient documentation

## 2017-09-04 MED ORDER — IOHEXOL 300 MG/ML  SOLN
75.0000 mL | Freq: Once | INTRAMUSCULAR | Status: AC | PRN
  Administered 2017-09-04: 75 mL via INTRAVENOUS

## 2017-09-08 ENCOUNTER — Inpatient Hospital Stay (HOSPITAL_BASED_OUTPATIENT_CLINIC_OR_DEPARTMENT_OTHER): Payer: Medicare Other | Admitting: Internal Medicine

## 2017-09-08 ENCOUNTER — Encounter: Payer: Self-pay | Admitting: Internal Medicine

## 2017-09-08 ENCOUNTER — Telehealth: Payer: Self-pay | Admitting: Internal Medicine

## 2017-09-08 VITALS — BP 116/73 | HR 77 | Temp 97.7°F | Resp 19 | Ht 72.0 in | Wt 154.4 lb

## 2017-09-08 DIAGNOSIS — R21 Rash and other nonspecific skin eruption: Secondary | ICD-10-CM

## 2017-09-08 DIAGNOSIS — C3431 Malignant neoplasm of lower lobe, right bronchus or lung: Secondary | ICD-10-CM | POA: Diagnosis not present

## 2017-09-08 DIAGNOSIS — J449 Chronic obstructive pulmonary disease, unspecified: Secondary | ICD-10-CM

## 2017-09-08 DIAGNOSIS — Z923 Personal history of irradiation: Secondary | ICD-10-CM | POA: Diagnosis not present

## 2017-09-08 DIAGNOSIS — Z7189 Other specified counseling: Secondary | ICD-10-CM

## 2017-09-08 DIAGNOSIS — C3491 Malignant neoplasm of unspecified part of right bronchus or lung: Secondary | ICD-10-CM

## 2017-09-08 DIAGNOSIS — C3490 Malignant neoplasm of unspecified part of unspecified bronchus or lung: Secondary | ICD-10-CM

## 2017-09-08 DIAGNOSIS — L299 Pruritus, unspecified: Secondary | ICD-10-CM | POA: Diagnosis not present

## 2017-09-08 DIAGNOSIS — Z9221 Personal history of antineoplastic chemotherapy: Secondary | ICD-10-CM | POA: Diagnosis not present

## 2017-09-08 DIAGNOSIS — Z5112 Encounter for antineoplastic immunotherapy: Secondary | ICD-10-CM

## 2017-09-08 NOTE — Telephone Encounter (Signed)
Scheduled appt per 4/22 los - Gave patient aVS and calender per los. Central radiology to contact patient with ct scan .

## 2017-09-08 NOTE — Progress Notes (Signed)
[facility] [phone]   Fax:(336) [phone]  OFFICE PROGRESS NOTE  [name], MD [place] [address]  DIAGNOSIS: Stage IIIA (T4, N1, M0) non-small cell lung cancer, favoring adenocarcinoma presented with large right lower lobe lung mass in addition to right hilar lymphadenopathy and right upper lobe apical pulmonary nodule diagnosed in July 2018.  Guardant 360 testing: negative EGFR, ALK, ROS 1 and BRAF mutations.  PRIOR THERAPY: Concurrent chemoradiation with weekly carboplatin for AUC of 2 and paclitaxel 45 MG/M2. First dose 12/30/2016.status post 7 cycles.  Last dose was given 02/10/2017 with partial response.  CURRENT THERAPY: Consolidation immunotherapy with Imfinzi (Durvalumab) 10 mg/KG every 2 weeks, first dose April 08, 2017.  Status post 6 cycles.  He received cycle 3 of this treatment in [place].  His treatment was discontinued secondary to immunotherapy mediated skin rash.  INTERVAL HISTORY: Harry Allen 77 y.o. male returns to the clinic today for follow-up visit accompanied by his wife.  The patient is feeling fine today with no specific complaints except for the skin rash on the lower abdomen and back.  He was seen by dermatology and currently receiving treatment for questionable psoriasis.  He denied having any chest pain, shortness breath, cough or hemoptysis.  He denied having any fever or chills.  He has no nausea, vomiting, diarrhea or constipation.  He denied having any recent weight loss or night sweats.  The patient had a repeat CT scan of the chest performed recently and is here for evaluation and discussion of his discuss results.  MEDICAL HISTORY: Past Medical History:  Diagnosis Date  . Adenocarcinoma of right lung, stage 3 ([place]) 12/19/2016  . Cancer ([place])    lung cancer  . COPD (chronic obstructive pulmonary disease) ([place])   . H/O hernia repair   . Sleep apnea     ALLERGIES:  is allergic to no  known allergies.  MEDICATIONS:  Current Outpatient Medications  Medication Sig Dispense Refill  . aspirin EC 81 MG tablet Take by mouth.    . clotrimazole-betamethasone (LOTRISONE) cream Apply 1 application topically 2 (two) times daily as needed. 45 g 1  . ipratropium-albuterol (DUONEB) 0.5-2.5 (3) MG/3ML SOLN Take 3 mLs by nebulization every 6 (six) hours as needed. And as needed (Patient not taking: Reported on 05/16/2017) 360 mL 5  . predniSONE (DELTASONE) 10 MG tablet Taper: Seven tab po qd x 7 days, 6 tab po qd x 7 days, 5 tab po qd x 7 days, 4 tab po qd x 7 days, 3 tab po qd x 7 days, 2 tab po qd x 7 days, one tab po qd x 7 days. 192 tablet 0  . Tiotropium Bromide Monohydrate (SPIRIVA RESPIMAT) 2.5 MCG/ACT AERS Inhale 2 puffs into the lungs daily. 1 Inhaler 3   No current facility-administered medications for this visit.     SURGICAL HISTORY:  Past Surgical History:  Procedure Laterality Date  . APPENDECTOMY    . HERNIA REPAIR    . HYDROCELE EXCISION     on neck; 1968  . VIDEO BRONCHOSCOPY WITH ENDOBRONCHIAL ULTRASOUND N/A 12/09/2016   Procedure: VIDEO BRONCHOSCOPY WITH ENDOBRONCHIAL ULTRASOUND;  Surgeon: [name], MD;  Location: [facility] OR;  Service: Thoracic;  Laterality: N/A;    REVIEW OF SYSTEMS:  Constitutional: negative Eyes: negative Ears, nose, mouth, throat, and face: negative Respiratory: negative Cardiovascular: negative Gastrointestinal: negative Genitourinary:negative Integument/breast: positive for pruritus and rash Hematologic/lymphatic: negative Musculoskeletal:negative Neurological: negative  Behavioral/Psych: negative Endocrine: negative Allergic/Immunologic: negative   PHYSICAL EXAMINATION: General appearance: alert, cooperative and no distress Head: Normocephalic, without obvious abnormality, atraumatic Neck: no adenopathy, no JVD, supple, symmetrical, trachea midline and thyroid not enlarged, symmetric, no tenderness/mass/nodules Lymph  nodes: Cervical, supraclavicular, and axillary nodes normal. Resp: clear to auscultation bilaterally Back: symmetric, no curvature. ROM normal. No CVA tenderness. Cardio: regular rate and rhythm, S1, S2 normal, no murmur, click, rub or gallop GI: soft, non-tender; bowel sounds normal; no masses,  no organomegaly Extremities: extremities normal, atraumatic, no cyanosis or edema Neurologic: Alert and oriented X 3, normal strength and tone. Normal symmetric reflexes. Normal coordination and gait         ECOG PERFORMANCE STATUS: 1 - Symptomatic but completely ambulatory  Blood pressure 116/73, pulse 77, temperature 97.7 F (36.5 C), temperature source Oral, resp. rate 19, height 6' (1.829 m), weight 154 lb 6.4 oz (70 kg), SpO2 100 %.  LABORATORY DATA: Lab Results  Component Value Date   WBC 7.1 08/21/2017   HGB 13.7 08/21/2017   HCT 40.6 08/21/2017   MCV 92.7 08/21/2017   PLT 255 08/21/2017      Chemistry      Component Value Date/Time   NA 136 08/21/2017 0920   NA 137 05/16/2017 1340   K 3.9 08/21/2017 0920   K 4.2 05/16/2017 1340   CL 101 08/21/2017 0920   CO2 29 08/21/2017 0920   CO2 26 05/16/2017 1340   BUN 11 08/21/2017 0920   BUN 10.9 05/16/2017 1340   CREATININE 0.81 08/21/2017 0920   CREATININE 0.87 07/15/2017 1448   CREATININE 0.8 05/16/2017 1340      Component Value Date/Time   CALCIUM 9.6 08/21/2017 0920   CALCIUM 9.3 05/16/2017 1340   ALKPHOS 68 08/21/2017 0920   ALKPHOS 96 05/16/2017 1340   AST 30 08/21/2017 0920   AST 17 07/15/2017 1448   AST 19 05/16/2017 1340   ALT 21 08/21/2017 0920   ALT 14 07/15/2017 1448   ALT 12 05/16/2017 1340   BILITOT 0.4 08/21/2017 0920   BILITOT 0.4 07/15/2017 1448   BILITOT 0.30 05/16/2017 1340       RADIOGRAPHIC STUDIES: Ct Chest W Contrast  Result Date: 09/05/2017 CLINICAL DATA:  Lung cancer diagnosed 5/18 with chemotherapy and radiation therapy completed 11/18. Immunotherapy stopped in February. EXAM: CT  CHEST WITH CONTRAST TECHNIQUE: Multidetector CT imaging of the chest was performed during intravenous contrast administration. CONTRAST:  109m OMNIPAQUE IOHEXOL 300 MG/ML  SOLN COMPARISON:  06/30/2017 FINDINGS: Cardiovascular: Advanced aortic and branch vessel atherosclerosis. Tortuous thoracic aorta. Normal heart size, without pericardial effusion. Multivessel coronary artery atherosclerosis. No central pulmonary embolism, on this non-dedicated study. Mediastinum/Nodes: No supraclavicular adenopathy. No mediastinal or hilar adenopathy. Lungs/Pleura: No pleural fluid. Presumed secretions in the right-side of the trachea. Moderate bullous type emphysema. Minimal decrease in evolving radiation change within the medial right upper and central right lower lobe. Nodular component described previously measures 2.0 x 1.8 cm versus 2.2 x 2.0 cm on the prior. Upper Abdomen: Normal imaged portions of the liver, spleen, stomach, pancreas, gallbladder, biliary tract, right adrenal gland, and left kidney. 3.1 cm right renal cyst. Bilateral renal vascular calcifications. No mild left adrenal thickening and enlargement, similar. Abdominal aortic atherosclerosis. Musculoskeletal: No acute osseous abnormality. S-shaped thoracolumbar spine curvature. IMPRESSION: 1. Slight decrease in evolving radiation change throughout the medial right lung. 2. Decrease in remaining right perihilar nodular component. 3. No thoracic adenopathy. 4. Coronary artery atherosclerosis. Aortic Atherosclerosis (ICD10-I70.0). Electronically  Signed   By: Abigail Miyamoto M.D.   On: 09/05/2017 14:46    ASSESSMENT AND PLAN: This is a very pleasant 78 years old African-American male recently diagnosed with a stage IIIA (T4, N1, M0) non-small cell lung cancer, favoring adenocarcinoma diagnosed in July 2018. The patient underwent a course of concurrent chemoradiation with weekly carboplatin and paclitaxel status post 7 cycles and tolerated this treatment fairly  well.  He has partial response to this treatment. He is currently undergoing consolidation treatment with immunotherapy with Imfinzi (Durvalumab) status post 6 cycles.  He received some of his treatment in [place]. His treatment was discontinued secondary to development of significant skin rash on the lower abdomen and back as well as lower extremities. He was seen by dermatology recently and currently undergoing treatment for questionable psoriasis. The patient had repeat CT scan of the chest performed recently.  I personally and independently reviewed the scans and discussed the results with the patient and his wife.  His scan showed no concerning findings for disease progression. I recommended for the patient to continue on observation for now.  I will see him back for follow-up visit in 3 months for evaluation after repeating CT scan of the chest for restaging of his disease. For the persistent skin rash, he will continue his current treatment by dermatology. He was advised to call immediately if he has any concerning symptoms in the interval. The patient voices understanding of current disease status and treatment options and is in agreement with the current care plan. All questions were answered. The patient knows to call the clinic with any problems, questions or concerns. We can certainly see the patient much sooner if necessary.  Disclaimer: This note was dictated with voice recognition software. Similar sounding words can inadvertently be transcribed and may not be corrected upon review.

## 2017-09-15 ENCOUNTER — Telehealth: Payer: Self-pay | Admitting: Internal Medicine

## 2017-09-15 NOTE — Telephone Encounter (Signed)
Patient called to reschedule  °

## 2017-09-30 ENCOUNTER — Other Ambulatory Visit: Payer: Medicare Other

## 2017-10-02 ENCOUNTER — Ambulatory Visit: Payer: Medicare Other | Admitting: Oncology

## 2017-10-08 DIAGNOSIS — B9689 Other specified bacterial agents as the cause of diseases classified elsewhere: Secondary | ICD-10-CM | POA: Diagnosis not present

## 2017-10-08 DIAGNOSIS — J208 Acute bronchitis due to other specified organisms: Secondary | ICD-10-CM | POA: Diagnosis not present

## 2017-10-08 DIAGNOSIS — J0140 Acute pansinusitis, unspecified: Secondary | ICD-10-CM | POA: Diagnosis not present

## 2017-10-08 DIAGNOSIS — L409 Psoriasis, unspecified: Secondary | ICD-10-CM | POA: Diagnosis not present

## 2017-10-09 DIAGNOSIS — L409 Psoriasis, unspecified: Secondary | ICD-10-CM | POA: Diagnosis not present

## 2017-10-28 ENCOUNTER — Telehealth: Payer: Self-pay | Admitting: Medical Oncology

## 2017-10-28 NOTE — Telephone Encounter (Signed)
err

## 2017-10-28 NOTE — Telephone Encounter (Signed)
Recovered from pneumonia .  However, he is still coughing up phlegm " like when his cancer was discovered." Asking if his scan can be moved up from July 25th . Message to [place].

## 2017-10-29 ENCOUNTER — Other Ambulatory Visit: Payer: Self-pay | Admitting: Medical Oncology

## 2017-10-29 NOTE — Telephone Encounter (Signed)
Changed date on ct scan and lab orders to be done 6/21. Needs f/u a few days later.

## 2017-10-29 NOTE — Telephone Encounter (Signed)
Okay to move his scan and visit sooner.

## 2017-10-30 ENCOUNTER — Telehealth: Payer: Self-pay | Admitting: Internal Medicine

## 2017-10-30 ENCOUNTER — Telehealth: Payer: Self-pay

## 2017-10-30 NOTE — Telephone Encounter (Signed)
Left message with appt date and time. For f/u per 6/12 sch message.

## 2017-11-06 DIAGNOSIS — H903 Sensorineural hearing loss, bilateral: Secondary | ICD-10-CM | POA: Diagnosis not present

## 2017-11-06 DIAGNOSIS — H9193 Unspecified hearing loss, bilateral: Secondary | ICD-10-CM | POA: Diagnosis not present

## 2017-11-06 NOTE — Telephone Encounter (Signed)
Error openiing

## 2017-11-07 ENCOUNTER — Encounter (HOSPITAL_COMMUNITY): Payer: Self-pay | Admitting: Radiology

## 2017-11-07 ENCOUNTER — Ambulatory Visit (HOSPITAL_COMMUNITY)
Admission: RE | Admit: 2017-11-07 | Discharge: 2017-11-07 | Disposition: A | Discharge status: Home / Self Care | Payer: Medicare Other | Source: Ambulatory Visit | Attending: Internal Medicine | Admitting: Internal Medicine

## 2017-11-07 ENCOUNTER — Inpatient Hospital Stay: Payer: Medicare Other | Attending: Internal Medicine

## 2017-11-07 DIAGNOSIS — C3490 Malignant neoplasm of unspecified part of unspecified bronchus or lung: Secondary | ICD-10-CM | POA: Insufficient documentation

## 2017-11-07 DIAGNOSIS — C3431 Malignant neoplasm of lower lobe, right bronchus or lung: Secondary | ICD-10-CM | POA: Insufficient documentation

## 2017-11-07 DIAGNOSIS — I700 Atherosclerosis of aorta: Secondary | ICD-10-CM | POA: Diagnosis not present

## 2017-11-07 DIAGNOSIS — C3491 Malignant neoplasm of unspecified part of right bronchus or lung: Secondary | ICD-10-CM

## 2017-11-07 DIAGNOSIS — J439 Emphysema, unspecified: Secondary | ICD-10-CM | POA: Diagnosis not present

## 2017-11-07 LAB — CBC WITH DIFFERENTIAL/PLATELET
Basophils Absolute: 0 10*3/uL (ref 0.0–0.1)
Basophils Relative: 0 %
Eosinophils Absolute: 0.5 10*3/uL (ref 0.0–0.5)
Eosinophils Relative: 7 %
HEMATOCRIT: 38.4 % (ref 38.4–49.9)
HEMOGLOBIN: 13 g/dL (ref 13.0–17.1)
Lymphocytes Relative: 15 %
Lymphs Abs: 1.1 10*3/uL (ref 0.9–3.3)
MCH: 31.9 pg (ref 27.2–33.4)
MCHC: 33.9 g/dL (ref 32.0–36.0)
MCV: 94.3 fL (ref 79.3–98.0)
Monocytes Absolute: 1.1 10*3/uL — ABNORMAL HIGH (ref 0.1–0.9)
Monocytes Relative: 15 %
NEUTROS ABS: 4.6 10*3/uL (ref 1.5–6.5)
Neutrophils Relative %: 63 %
Platelets: 270 10*3/uL (ref 140–400)
RBC: 4.07 MIL/uL — AB (ref 4.20–5.82)
RDW: 13.3 % (ref 11.0–14.6)
WBC: 7.3 10*3/uL (ref 4.0–10.3)

## 2017-11-07 LAB — COMPREHENSIVE METABOLIC PANEL
ALT: 11 U/L (ref 0–55)
AST: 21 U/L (ref 5–34)
Albumin: 3.5 g/dL (ref 3.5–5.0)
Alkaline Phosphatase: 68 U/L (ref 40–150)
Anion gap: 7 (ref 3–11)
BILIRUBIN TOTAL: 0.4 mg/dL (ref 0.2–1.2)
BUN: 14 mg/dL (ref 7–26)
CO2: 25 mmol/L (ref 22–29)
CREATININE: 0.87 mg/dL (ref 0.70–1.30)
Calcium: 9.3 mg/dL (ref 8.4–10.4)
Chloride: 105 mmol/L (ref 98–109)
GFR calc Af Amer: >60 mL/min (ref >60)
Glucose, Bld: 90 mg/dL (ref 70–140)
Potassium: 4.1 mmol/L (ref 3.5–5.1)
Sodium: 137 mmol/L (ref 136–145)
TOTAL PROTEIN: 7.7 g/dL (ref 6.4–8.3)

## 2017-11-07 MED ORDER — IOHEXOL 300 MG/ML  SOLN
75.0000 mL | Freq: Once | INTRAMUSCULAR | Status: AC | PRN
  Administered 2017-11-07: 75 mL via INTRAVENOUS

## 2017-11-16 NOTE — Progress Notes (Addendum)
[facility]  [phone] [phone]  Clinic Follow up Note   Patient Care Team: [name], MD as PCP - General (Family Medicine) 11/17/2017  DIAGNOSIS: Stage IIIA (T4, N1, M0) non-small cell lung cancer, favoring adenocarcinoma presented with large right lower lobe lung mass in addition to right hilar lymphadenopathy and right upper lobe apical pulmonary nodule diagnosed in July 2018.  Guardant 360 testing: negative EGFR, ALK, ROS 1 and BRAF mutations.  PRIOR THERAPY:  1. Concurrent chemoradiation with weekly carboplatin for AUC of 2 and paclitaxel 45 MG/M2. First dose 12/30/2016.status post 7 cycles.  Last dose was given 02/10/2017 with partial response. 2. Consolidation immunotherapy with Imfinzi (Durvalumab) 10 mg/KG every 2 weeks, first dose April 08, 2017.  Status post 6 cycles.  He received cycle 3 of this treatment in [place].  His treatment was discontinued secondary to immunotherapy mediated skin rash.  CURRENT THERAPY: Observation   INTERVAL HISTORY: Mr. Viscomi returns for f/u and discussion of restaging CT. He presents with his wife. He is on observation, last treatment with imfinzi in 06/2017 and discontinued due to skin rash which has cleared up significantly in the interim. He reports 7 weeks ago he developed sore throat and cough, he went to PCP who prescribed amoxicilin and mucinex, which he completed approximately 5 weeks ago. He continues to have respiratory congestion with cough, productive white sputum, and "breathlessness" on exertion. Not able to walk stairs without stopping which he was previously able to do. Nasal drainage is clear. Afebrile. Denies chest pain or hemoptysis.   REVIEW OF SYSTEMS:   Constitutional: Denies fevers, chills (+) weight loss Eyes: Denies blurriness of vision Ears, nose, mouth, throat, and face: Denies mucositis or sore throat Respiratory: Denies hemoptysis or wheezes (+) cough (+) dyspnea on  exertion  Cardiovascular: Denies palpitation, chest discomfort or lower extremity swelling Gastrointestinal:  Denies nausea, vomiting, constipation, diarrhea, hematochezia, heartburn or change in bowel habits Skin: (+) skin rashes to abdomen, back, buttock, improving (+) itching  Lymphatics: Denies new lymphadenopathy or easy bruising Neurological:Denies numbness, tingling or new weaknesses Behavioral/Psych: Mood is stable, no new changes  All other systems were reviewed with the patient and are negative.  MEDICAL HISTORY:  Past Medical History:  Diagnosis Date  . Adenocarcinoma of right lung, stage 3 ([place]) 12/19/2016  . Cancer ([place])    lung cancer  . COPD (chronic obstructive pulmonary disease) ([place])   . H/O hernia repair   . Sleep apnea     SURGICAL HISTORY: Past Surgical History:  Procedure Laterality Date  . APPENDECTOMY    . HERNIA REPAIR    . HYDROCELE EXCISION     on neck; 1968  . VIDEO BRONCHOSCOPY WITH ENDOBRONCHIAL ULTRASOUND N/A 12/09/2016   Procedure: VIDEO BRONCHOSCOPY WITH ENDOBRONCHIAL ULTRASOUND;  Surgeon: [name], MD;  Location: [facility];  Service: Thoracic;  Laterality: N/A;    I have reviewed the social history and family history with the patient and they are unchanged from previous note.  ALLERGIES:  is allergic to no known allergies.  MEDICATIONS:  Current Outpatient Medications  Medication Sig Dispense Refill  . aspirin EC 81 MG tablet Take by mouth.    Marland Kitchen ipratropium-albuterol (DUONEB) 0.5-2.5 (3) MG/3ML SOLN Take 3 mLs by nebulization every 6 (six) hours as needed. And as needed 360 mL 5  . pravastatin (PRAVACHOL) 40 MG tablet Take 40 mg by mouth daily.    . Tiotropium Bromide Monohydrate (SPIRIVA RESPIMAT) 2.5 MCG/ACT AERS Inhale 2  puffs into the lungs daily. (Patient not taking: Reported on 11/17/2017) 1 Inhaler 3   No current facility-administered medications for this visit.     PHYSICAL EXAMINATION: ECOG PERFORMANCE STATUS: 1 -  Symptomatic but completely ambulatory BP 108/88 (Patient Position: Sitting)   Pulse 85   Temp 97.9 F (36.6 C) (Oral)   Resp 18   Ht 6' (1.829 m)   Wt 147 lb 9.6 oz (67 kg)   SpO2 96%   BMI 20.02 kg/m   GENERAL:alert, no distress and comfortable SKIN: rash to abdomen and back is flat with mild flakiness and patchy erythema, see photo  EYES: normal, Conjunctiva are pink and non-injected, sclera clear LYMPH:  no palpable cervical or supraclavicular lymphadenopathy LUNGS: clear to auscultation on left, wheezing on right; with normal breathing effort HEART: regular rate & rhythm and no murmurs and no lower extremity edema ABDOMEN:abdomen soft, non-tender and normal bowel sounds Musculoskeletal:no cyanosis of digits and no clubbing  NEURO: alert & oriented x 3 with fluent speech, no focal motor/sensory deficits          LABORATORY DATA:  I have reviewed the data as listed CBC Latest Ref Rng & Units 11/07/2017 08/21/2017 07/15/2017  WBC 4.0 - 10.3 K/uL 7.3 7.1 10.0  Hemoglobin 13.0 - 17.1 g/dL 13.0 13.7 14.0  Hematocrit 38.4 - 49.9 % 38.4 40.6 41.7  Platelets 140 - 400 K/uL 270 255 251     CMP Latest Ref Rng & Units 11/07/2017 08/21/2017 07/15/2017  Glucose 70 - 140 mg/dL 90 82 86  BUN 7 - 26 mg/dL '14 11 14  '$ Creatinine 0.70 - 1.30 mg/dL 0.87 0.81 0.87  Sodium 136 - 145 mmol/L 137 136 135(L)  Potassium 3.5 - 5.1 mmol/L 4.1 3.9 3.9  Chloride 98 - 109 mmol/L 105 101 99  CO2 22 - 29 mmol/L '25 29 25  '$ Calcium 8.4 - 10.4 mg/dL 9.3 9.6 9.3  Total Protein 6.4 - 8.3 g/dL 7.7 7.5 7.9  Total Bilirubin 0.2 - 1.2 mg/dL 0.4 0.4 0.4  Alkaline Phos 40 - 150 U/L 68 68 70  AST 5 - 34 U/L '21 30 17  '$ ALT 0 - 55 U/L '11 21 14      '$ RADIOGRAPHIC STUDIES: I have personally reviewed the radiological images as listed and agreed with the findings in the report. No results found.   ASSESSMENT & PLAN: This is a very pleasant 77 years old African-American male recently diagnosed with a stage IIIA (T4,  N1, M0) non-small cell lung cancer, favoring adenocarcinoma diagnosed in July 2018. S/p concurrent chemoradiation with taxol/carboplatin with partial response. He completed 6 cycles consolidation Imfinzi on 06/2017, treatment held due to psoriatic rash.   Mr. Minasyan appears stable. His rash has improved significantly, now using topical moisturizer only. He developed worsening pulmonary congestion and dyspnea over last 7 weeks that initially presented as URI. He completed antibiotics, symptoms persist. He is afebrile. WBC is normal. The patient was seen with Dr. Julien Nordmann who reviewed restaging CT and feels his symptoms are secondary to COPD and radiation changes. Given his stable CT, Dr. Julien Nordmann does not recommend restarting immunotherapy at this time. Will monitor with restaging CT in 3 months (ordered today) and f/u few days after. He will see pulmonologist [name] this week for further respiratory management.   Orders Placed This Encounter  Procedures  . CT Chest W Contrast    Standing Status:   Future    Standing Expiration Date:   11/17/2018  Order Specific Question:   ** REASON FOR EXAM (FREE TEXT)    Answer:   stage IIIA NSCLC s/p chemoradiation and immunotherapy, on observation    Order Specific Question:   If indicated for the ordered procedure, I authorize the administration of contrast media per Radiology protocol    Answer:   Yes    Order Specific Question:   Preferred imaging location?    Answer:   [facility]    Order Specific Question:   Radiology Contrast Protocol - do NOT remove file path    Answer:   [email or web address]   All questions were answered. The patient knows to call the clinic with any problems, questions or concerns. No barriers to learning was detected.     Alla Feeling, NP 11/17/17   ADDENDUM: Hematology/Oncology Attending: I had a face-to-face encounter with the patient today.  I recommended his care plan.  This is a very  pleasant 77 years old African-American male with history of a stage IIIa non-small cell lung cancer status post a course of concurrent chemoradiation with weekly carboplatin and paclitaxel with partial response.  This was followed by consolidation treatment with immunotherapy with Imfinzi (Durvalumab) status post status post 6 cycles.  His treatment has been on hold because of development of significant skin rash and worsening of psoriasis.  He was treated with a course of high-dose prednisone that was tapered slowly over the last several weeks.  His psoriasis has significantly improved.  We discontinued his treatment with immunotherapy at that point.  The patient had repeat CT scan of the chest performed recently.  I personally and independently reviewed the scans and discussed the results with the patient and his wife.  His a scan showed no concerning findings for disease progression. I recommended for the patient to continue in observation for now.  He will have repeat CT scan of the chest in 3 months for reevaluation and restaging of his disease.  He will come back for follow-up visit at that time. He was advised to call immediately if she has any concerning symptoms in the interval.

## 2017-11-17 ENCOUNTER — Telehealth: Payer: Self-pay | Admitting: Internal Medicine

## 2017-11-17 ENCOUNTER — Inpatient Hospital Stay: Payer: Medicare Other | Attending: Internal Medicine | Admitting: Nurse Practitioner

## 2017-11-17 ENCOUNTER — Encounter: Payer: Self-pay | Admitting: Nurse Practitioner

## 2017-11-17 VITALS — BP 108/88 | HR 85 | Temp 97.9°F | Resp 18 | Ht 72.0 in | Wt 147.6 lb

## 2017-11-17 DIAGNOSIS — Z85118 Personal history of other malignant neoplasm of bronchus and lung: Secondary | ICD-10-CM | POA: Diagnosis not present

## 2017-11-17 DIAGNOSIS — R21 Rash and other nonspecific skin eruption: Secondary | ICD-10-CM | POA: Diagnosis not present

## 2017-11-17 DIAGNOSIS — J449 Chronic obstructive pulmonary disease, unspecified: Secondary | ICD-10-CM | POA: Insufficient documentation

## 2017-11-17 DIAGNOSIS — C3490 Malignant neoplasm of unspecified part of unspecified bronchus or lung: Secondary | ICD-10-CM

## 2017-11-17 NOTE — Telephone Encounter (Signed)
Appointments scheduled AVS/Calendar printed per 7/1 los °

## 2017-11-18 NOTE — Progress Notes (Signed)
@Patient  ID: Harry Allen, male    DOB: 1940/12/02, 77 y.o.   MRN: [id number]  Chief Complaint  Patient presents with  . Acute Visit    Increased SOB, productive cough with thick green phlegm x 2 weeks. Chest tightness,     Referring provider: [name], MD  HPI: Harry Allen. Former smoker (45 pack years). Managed for COPD, OSA. Oncology History: stage IIIa non-small cell lung cancer, likely adenocarcinoma with a right lower lobe mass and right hilar lymphadenopathy. The diagnosis was made in July 2018 by bronchoscopy after he had some scant hemoptysis.  Regular Follow up with oncology   Recent [facility] Encounters:   ROV 05/02/17 --this is a follow-up visit for patient with a history of stage IIIa non-small cell lung cancer, undergoing chemoradiation and now on immunotherapy, also probable COPD with dyspnea and cough. He is starting to experience some  He also has a history of sleep apnea not on CPAP.  We discussed possibly changing him to nasal pillows but he did not bring in his old sleep study.  At his original visit we intended to start Spiriva to see if he would benefit. He wasn't sure that it helped him. He doesn't want to start it.     11/19/17 Acute   Patient seen in office today with increased shortness of breath, Mucous of yellow and green, afebrile, using duonebs 2-3x a week. Pt denies use of Spiriva. Known OSA with no CPAP use.    There is lots of confusion on how and when to take meds and patient and wife are interested in meds to better control his breathing.   Pt has had regular follow up with Dr. Neil Crouch  Allergies  Allergen Reactions  . No Known Allergies     Immunization History  Administered Date(s) Administered  . Influenza-Unspecified 02/26/2016    Past Medical History:  Diagnosis Date  . Adenocarcinoma of right lung, stage 3 ([facility]) 12/19/2016  . Cancer ([facility])    lung cancer  . COPD (chronic obstructive pulmonary  disease) ([facility])   . H/O hernia repair   . Sleep apnea     Tobacco History: Social History   Tobacco Use  Smoking Status Former Smoker  . Packs/day: 1.00  . Years: 8.00  . Pack years: 8.00  . Last attempt to quit: 11/19/2013  . Years since quitting: 4.0  Smokeless Tobacco Never Used   Counseling given: Yes Continue not smoking.   Outpatient Encounter Medications as of 11/19/2017  Medication Sig  . aspirin EC 81 MG tablet Take by mouth.  Marland Kitchen ipratropium-albuterol (DUONEB) 0.5-2.5 (3) MG/3ML SOLN Take 3 mLs by nebulization every 6 (six) hours as needed. And as needed  . pravastatin (PRAVACHOL) 40 MG tablet Take 40 mg by mouth daily.  . Tiotropium Bromide Monohydrate (SPIRIVA RESPIMAT) 2.5 MCG/ACT AERS Inhale 2 puffs into the lungs daily.  Marland Kitchen doxycycline (VIBRA-TABS) 100 MG tablet Take 1 tablet (100 mg total) by mouth 2 (two) times daily.  . predniSONE (DELTASONE) 10 MG tablet 4 tabs for 2 days, then 3 tabs for 2 days, 2 tabs for 2 days, then 1 tab for 2 days, then stop  . Tiotropium Bromide Monohydrate (SPIRIVA RESPIMAT) 2.5 MCG/ACT AERS Inhale 2 puffs into the lungs daily.   No facility-administered encounter medications on file as of 11/19/2017.      Review of Systems  Constitutional:  +fatigue    No  weight loss, night sweats,  fevers,  chills HEENT:   No headaches,  Difficulty swallowing,  Tooth/dental problems, or  Sore throat, No sneezing, itching, ear ache, nasal congestion, post nasal drip  CV: No chest pain,  orthopnea, PND, swelling in lower extremities, anasarca, dizziness, palpitations, syncope  GI: No heartburn, indigestion, abdominal pain, nausea, vomiting, diarrhea, change in bowel habits, loss of appetite, bloody stools Resp: +sob with rest and exertion, productive cough with green and yellow mucous   No coughing up of blood.   No chest wall deformity Skin: no rash, lesions, no skin changes. GU: no dysuria, change in color of urine, no urgency or frequency.  No flank pain,  no hematuria  MS:  No joint pain or swelling.  No decreased range of motion.  No back pain. Psych:  No change in mood or affect. No depression or anxiety.  No memory loss.   Physical Exam  BP 110/80   Temp 98 F (36.7 C) (Oral)   Ht 6' (1.829 m)   Wt 145 lb 6.4 oz (66 kg)   SpO2 92%   BMI 19.72 kg/m   Wt Readings from Last 3 Encounters:  11/19/17 145 lb 6.4 oz (66 kg)  11/17/17 147 lb 9.6 oz (67 kg)  09/08/17 154 lb 6.4 oz (70 kg)     GEN: A/Ox3; pleasant, NAD, thin    HEENT:  [place]/AT,  EACs-clear, TMs-wnl, NOSE-clear, THROAT-clear, no lesions, no postnasal drip or exudate noted.   NECK:  Supple w/ fair ROM; no JVD;  no lymphadenopathy.    RESP: +inspiratory and expiratory wheezes, poor air movement      no accessory muscle use, no dullness to percussion  CARD:  RRR, no m/r/g, no peripheral edema, pulses intact, no cyanosis or clubbing.  GI:   Soft & nt; nml bowel sounds; no organomegaly or masses detected.   Musco: Warm bil, no deformities or joint swelling noted.   Neuro: alert, no focal deficits noted.    Skin: Warm, no lesions or rashes    Lab Results:  CBC    Component Value Date/Time   WBC 7.3 11/07/2017 1416   RBC 4.07 (L) 11/07/2017 1416   HGB 13.0 11/07/2017 1416   HGB 14.0 07/15/2017 1448   HGB 12.2 (L) 05/16/2017 1340   HCT 38.4 11/07/2017 1416   HCT 36.9 (L) 05/16/2017 1340   PLT 270 11/07/2017 1416   PLT 251 07/15/2017 1448   PLT 320 05/16/2017 1340   MCV 94.3 11/07/2017 1416   MCV 94.7 05/16/2017 1340   MCH 31.9 11/07/2017 1416   MCHC 33.9 11/07/2017 1416   RDW 13.3 11/07/2017 1416   RDW 14.7 (H) 05/16/2017 1340   LYMPHSABS 1.1 11/07/2017 1416   LYMPHSABS 0.8 (L) 05/16/2017 1340   MONOABS 1.1 (H) 11/07/2017 1416   MONOABS 0.8 05/16/2017 1340   EOSABS 0.5 11/07/2017 1416   EOSABS 0.3 05/16/2017 1340   BASOSABS 0.0 11/07/2017 1416   BASOSABS 0.1 05/16/2017 1340    BMET    Component Value Date/Time   NA 137 11/07/2017 1416   NA  137 05/16/2017 1340   K 4.1 11/07/2017 1416   K 4.2 05/16/2017 1340   CL 105 11/07/2017 1416   CO2 25 11/07/2017 1416   CO2 26 05/16/2017 1340   GLUCOSE 90 11/07/2017 1416   GLUCOSE 80 05/16/2017 1340   BUN 14 11/07/2017 1416   BUN 10.9 05/16/2017 1340   CREATININE 0.87 11/07/2017 1416   CREATININE 0.87 07/15/2017 1448   CREATININE 0.8 05/16/2017 1340  CALCIUM 9.3 11/07/2017 1416   CALCIUM 9.3 05/16/2017 1340   GFRNONAA >60 11/07/2017 1416   GFRNONAA >60 07/15/2017 1448   GFRAA >60 11/07/2017 1416   GFRAA >60 07/15/2017 1448    BNP No results found for: BNP  ProBNP No results found for: PROBNP  Imaging: Ct Chest W Contrast  Result Date: 11/10/2017 CLINICAL DATA:  Lung cancer, non-small cell lung cancer. Chemo and radiation therapy complete. Immune therapy complete. EXAM: CT CHEST WITH CONTRAST TECHNIQUE: Multidetector CT imaging of the chest was performed during intravenous contrast administration. CONTRAST:  27mL OMNIPAQUE IOHEXOL 300 MG/ML  SOLN COMPARISON:  09/04/2017 FINDINGS: Cardiovascular: Coronary artery calcification and aortic atherosclerotic calcification. Mediastinum/Nodes: No axillary supraclavicular adenopathy. No mediastinal hilar adenopathy. Peribronchial thickening in the RIGHT hilum is unchanged and extends into the mediastinum. No measurable lymphadenopathy Lungs/Pleura: Perihilar consolidation and bronchiectasis in a linear pattern typical of radiation change. Pattern not changed from comparison CT 09/04/2017. No new nodularity. LEFT lung clear. Centrilobular emphysema the upper lobes. Upper Abdomen: Limited view of the liver, kidneys, pancreas are unremarkable. Normal adrenal glands. Musculoskeletal: No aggressive osseous lesion IMPRESSION: 1. Stable in post therapy change in the RIGHT hilum without evidence local recurrence. 2. No evidence of lung cancer recurrence. Aortic Atherosclerosis (ICD10-I70.0) and Emphysema (ICD10-J43.9). Electronically Signed   By:  Suzy Bouchard M.D.   On: 11/10/2017 08:37     Assessment & Plan:   Pleasant pt seen in office today. Pt with COPD exacerbation. Will treat with prednisone taper and doxycycline. Pt and wife are willing to restart Spiriva to help with breathing.  Sample provided today in office.  Patient to call our office and let us know how they are doing after completing samples.  Then we can place order.  After much discussion with patient, spouse and nature breathing.  Patient has agreed to try to complete his pulmonary function test.  I will have patient try to complete this before his next appointment with Dr. Lamonte Allen.  Follow-up in 6 to 8 weeks.  Sooner if symptoms do not improve.  COPD (chronic obstructive pulmonary disease) (HCC) Spiriva Respimat 2.5 >>> 2 puffs daily >>> Take this no matter what >>> Sample provided in office today >>> Call our office when finished with samples and let us know how you are doing with your Spiriva  Prednisone 10mg  tablet  >>>4 tabs for 2 days, then 3 tabs for 2 days, 2 tabs for 2 days, then 1 tab for 2 days, then stop  Doxycycline >>> 1 100 mg tablet every 12 hours for 7 days >>>take with food  >>>wear sunscreen   Can use DuoNeb nebulized treatments at home as needed for shortness of breath or wheezing  Pulmonary function test with next appointment.  See Dr. Lamonte Allen after completing.  If Dr. Lamonte Allen is unavailable more than happy to see you.    Lung mass Keep follow up with oncology  This appointment was 29 minutes along with 50% of that time in direct face-to-face care: plan of care, assessment, discussion with patient and spouse.   Lauraine Rinne, NP 11/19/2017

## 2017-11-19 ENCOUNTER — Encounter: Payer: Self-pay | Admitting: Pulmonary Disease

## 2017-11-19 ENCOUNTER — Ambulatory Visit (INDEPENDENT_AMBULATORY_CARE_PROVIDER_SITE_OTHER): Payer: Medicare Other | Admitting: Pulmonary Disease

## 2017-11-19 VITALS — BP 110/80 | Temp 98.0°F | Ht 72.0 in | Wt 145.4 lb

## 2017-11-19 DIAGNOSIS — J449 Chronic obstructive pulmonary disease, unspecified: Secondary | ICD-10-CM

## 2017-11-19 DIAGNOSIS — R918 Other nonspecific abnormal finding of lung field: Secondary | ICD-10-CM | POA: Diagnosis not present

## 2017-11-19 MED ORDER — PREDNISONE 10 MG PO TABS: ORAL_TABLET | ORAL | 0 refills | Status: DC

## 2017-11-19 MED ORDER — DOXYCYCLINE HYCLATE 100 MG PO TABS: 100.0000 mg | ORAL_TABLET | Freq: Two times a day (BID) | ORAL | 0 refills | Status: DC

## 2017-11-19 MED ORDER — TIOTROPIUM BROMIDE MONOHYDRATE 2.5 MCG/ACT IN AERS
2.0000 | INHALATION_SPRAY | Freq: Every day | RESPIRATORY_TRACT | 0 refills | Status: DC
Start: 2017-11-19 — End: 2018-02-09

## 2017-11-19 NOTE — Assessment & Plan Note (Signed)
Keep follow up with oncology

## 2017-11-19 NOTE — Patient Instructions (Addendum)
Spiriva Respimat 2.5 >>> 2 puffs daily >>> Take this no matter what >>> Sample provided in office today >>> Call our office when finished with samples and let us know how you are doing with your Spiriva  Prednisone 10mg  tablet  >>>4 tabs for 2 days, then 3 tabs for 2 days, 2 tabs for 2 days, then 1 tab for 2 days, then stop  Doxycycline >>> 1 100 mg tablet every 12 hours for 7 days >>>take with food  >>>wear sunscreen   Can use DuoNeb nebulized treatments at home as needed for shortness of breath or wheezing  Pulmonary function test with next appointment.  See [name] after completing.  If [name] is unavailable more than happy to see you.    Please contact the office if your symptoms worsen or you have concerns that you are not improving.   Thank you for choosing [facility] for your healthcare, and for allowing [place] to partner with you on your healthcare journey. I am thankful to be able to provide care to you today.   Wyn Quaker FNP-C

## 2017-11-19 NOTE — Assessment & Plan Note (Signed)
Spiriva Respimat 2.5 >>> 2 puffs daily >>> Take this no matter what >>> Sample provided in office today >>> Call our office when finished with samples and let us know how you are doing with your Spiriva  Prednisone 10mg  tablet  >>>4 tabs for 2 days, then 3 tabs for 2 days, 2 tabs for 2 days, then 1 tab for 2 days, then stop  Doxycycline >>> 1 100 mg tablet every 12 hours for 7 days >>>take with food  >>>wear sunscreen   Can use DuoNeb nebulized treatments at home as needed for shortness of breath or wheezing  Pulmonary function test with next appointment.  See [name] after completing.  If [name] is unavailable more than happy to see you.

## 2017-11-24 ENCOUNTER — Telehealth: Payer: Self-pay

## 2017-11-24 NOTE — Telephone Encounter (Signed)
Printed a calender of the upcoming appointment. Per 7/8 phone msg return and also will be mailing a letter, and ct information

## 2017-11-25 ENCOUNTER — Telehealth: Payer: Self-pay

## 2017-11-25 NOTE — Telephone Encounter (Signed)
Patient wife returned call to verify that he will be keeping his appointment that is scheduled in October. Per 7/9 phone que

## 2017-11-25 NOTE — Telephone Encounter (Signed)
Patient family called on 7/8 and requested to cancel and also reschedule [place] [facility] appointment that is scheduled for 10/13 and 10/15 (lab and  [facility]) I Levada Dy) tried to call her back and see which was the request for. Asked that she recall and verify so scheduling would be able to meet her request.

## 2017-12-10 ENCOUNTER — Other Ambulatory Visit: Payer: Medicare Other

## 2017-12-11 ENCOUNTER — Ambulatory Visit (HOSPITAL_COMMUNITY): Payer: Medicare Other

## 2017-12-11 ENCOUNTER — Other Ambulatory Visit: Payer: Medicare Other

## 2017-12-15 ENCOUNTER — Ambulatory Visit: Payer: Medicare Other | Admitting: Internal Medicine

## 2017-12-26 ENCOUNTER — Ambulatory Visit: Payer: Medicare Other | Admitting: Emergency Medicine

## 2018-01-01 ENCOUNTER — Ambulatory Visit: Payer: Medicare Other | Admitting: Emergency Medicine

## 2018-01-28 ENCOUNTER — Other Ambulatory Visit: Payer: Self-pay | Admitting: Emergency Medicine

## 2018-01-28 DIAGNOSIS — J449 Chronic obstructive pulmonary disease, unspecified: Secondary | ICD-10-CM

## 2018-01-29 ENCOUNTER — Ambulatory Visit: Payer: Medicare Other | Admitting: Pulmonary Disease

## 2018-02-05 ENCOUNTER — Ambulatory Visit: Payer: Medicare Other | Admitting: Emergency Medicine

## 2018-02-09 ENCOUNTER — Ambulatory Visit (INDEPENDENT_AMBULATORY_CARE_PROVIDER_SITE_OTHER): Payer: Medicare Other | Admitting: Emergency Medicine

## 2018-02-09 ENCOUNTER — Encounter: Payer: Self-pay | Admitting: Emergency Medicine

## 2018-02-09 DIAGNOSIS — C3491 Malignant neoplasm of unspecified part of right bronchus or lung: Secondary | ICD-10-CM

## 2018-02-09 DIAGNOSIS — J449 Chronic obstructive pulmonary disease, unspecified: Secondary | ICD-10-CM | POA: Diagnosis not present

## 2018-02-09 DIAGNOSIS — T07XXXA Unspecified multiple injuries, initial encounter: Secondary | ICD-10-CM

## 2018-02-09 MED ORDER — TIOTROPIUM BROMIDE MONOHYDRATE 2.5 MCG/ACT IN AERS: 2.0000 | INHALATION_SPRAY | Freq: Every day | RESPIRATORY_TRACT | 5 refills | Status: DC

## 2018-02-09 MED ORDER — CEPHALEXIN 500 MG PO CAPS: 500.0000 mg | ORAL_CAPSULE | Freq: Three times a day (TID) | ORAL | 0 refills | Status: DC

## 2018-02-09 MED ORDER — ALBUTEROL SULFATE HFA 108 (90 BASE) MCG/ACT IN AERS: 2.0000 | INHALATION_SPRAY | RESPIRATORY_TRACT | 5 refills | Status: DC | PRN

## 2018-02-09 NOTE — Assessment & Plan Note (Signed)
He appears to have benefited from the addition of Spiriva Respimat.  We will plan to continue this.  He will keep DuoNeb available to use as needed.  We will give him an albuterol inhaler so that he will have a portable rescue medication.
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
## 2018-02-09 NOTE — Assessment & Plan Note (Signed)
Currently on observation and following with Dr. Julien Nordmann with oncology.  Most recent CT scan of the chest 11/07/2017 was reassuring.

## 2018-02-09 NOTE — Patient Instructions (Addendum)
We will continue Spiriva Respimat 2 puffs once daily. Please keep DuoNeb available to use up to every 6 hours if needed for shortness of breath. We will prescribe an albuterol inhaler for you to keep available to use 2 puffs up to every 4 hours if needed for shortness of breath, chest tightness, wheezing. Please take Keflex 500 mg 3 times a day for the next 7 days. Continue to clean and dress your left arm and right hand abrasions daily.  You need to make an appointment to see Dr. Melford Aase so that he can look at these areas and ensure they are healing appropriately. Follow with Dr. Julien Nordmann as planned. Follow with Dr Lamonte Sakai in 6 months or sooner if you have any problems

## 2018-02-09 NOTE — Assessment & Plan Note (Signed)
He has abrasions on the lateral aspect of his left upper extremity up to 6 inches above the elbow.  The deepest abrasion is just inferior to the elbow and has Steri-Strips in place.  There is no purulence or odor.  He has similar abrasions on the dorsal aspects of both hands at his knuckles.  They have been cleaning this daily and dressing with an antibiotic ointment.  I cleaned the lesions in the office after examination.  I do not think there is any evidence of active infection.  I will treat him with 7 days of Keflex and I asked him to follow-up with Dr. Melford Aase to ensure that he is healing appropriately.

## 2018-02-09 NOTE — Assessment & Plan Note (Signed)
Untreated. He is not interested in starting CPAP

## 2018-02-18 DIAGNOSIS — S51002A Unspecified open wound of left elbow, initial encounter: Secondary | ICD-10-CM | POA: Diagnosis not present

## 2018-02-26 ENCOUNTER — Inpatient Hospital Stay: Payer: Medicare Other | Attending: Internal Medicine

## 2018-02-26 ENCOUNTER — Ambulatory Visit (HOSPITAL_COMMUNITY)
Admission: RE | Admit: 2018-02-26 | Discharge: 2018-02-26 | Disposition: A | Discharge status: Home / Self Care | Payer: Medicare Other | Source: Ambulatory Visit | Attending: Nurse Practitioner | Admitting: Nurse Practitioner

## 2018-02-26 DIAGNOSIS — Y842 Radiological procedure and radiotherapy as the cause of abnormal reaction of the patient, or of later complication, without mention of misadventure at the time of the procedure: Secondary | ICD-10-CM | POA: Diagnosis not present

## 2018-02-26 DIAGNOSIS — I700 Atherosclerosis of aorta: Secondary | ICD-10-CM | POA: Diagnosis not present

## 2018-02-26 DIAGNOSIS — C3490 Malignant neoplasm of unspecified part of unspecified bronchus or lung: Secondary | ICD-10-CM

## 2018-02-26 DIAGNOSIS — J439 Emphysema, unspecified: Secondary | ICD-10-CM | POA: Diagnosis not present

## 2018-02-26 DIAGNOSIS — Z85118 Personal history of other malignant neoplasm of bronchus and lung: Secondary | ICD-10-CM | POA: Diagnosis not present

## 2018-02-26 DIAGNOSIS — Z79899 Other long term (current) drug therapy: Secondary | ICD-10-CM | POA: Diagnosis not present

## 2018-02-26 DIAGNOSIS — R5382 Chronic fatigue, unspecified: Secondary | ICD-10-CM

## 2018-02-26 DIAGNOSIS — C3491 Malignant neoplasm of unspecified part of right bronchus or lung: Secondary | ICD-10-CM

## 2018-02-26 LAB — CBC WITH DIFFERENTIAL/PLATELET
Abs Immature Granulocytes: 0.03 10*3/uL (ref 0.00–0.07)
Basophils Absolute: 0.1 10*3/uL (ref 0.0–0.1)
Basophils Relative: 1 %
Eosinophils Absolute: 0.6 10*3/uL — ABNORMAL HIGH (ref 0.0–0.5)
Eosinophils Relative: 8 %
HEMATOCRIT: 40.1 % (ref 39.0–52.0)
HEMOGLOBIN: 13.2 g/dL (ref 13.0–17.0)
IMMATURE GRANULOCYTES: 0 %
LYMPHS ABS: 1 10*3/uL (ref 0.7–4.0)
LYMPHS PCT: 15 %
MCH: 30 pg (ref 26.0–34.0)
MCHC: 32.9 g/dL (ref 30.0–36.0)
MCV: 91.1 fL (ref 80.0–100.0)
MONO ABS: 0.8 10*3/uL (ref 0.1–1.0)
MONOS PCT: 12 %
NEUTROS ABS: 4.3 10*3/uL (ref 1.7–7.7)
Neutrophils Relative %: 64 %
Platelets: 262 10*3/uL (ref 150–400)
RBC: 4.4 MIL/uL (ref 4.22–5.81)
RDW: 12.9 % (ref 11.5–15.5)
WBC: 6.8 10*3/uL (ref 4.0–10.5)
nRBC: 0 % (ref 0.0–0.2)

## 2018-02-26 LAB — COMPREHENSIVE METABOLIC PANEL
ALBUMIN: 3.5 g/dL (ref 3.5–5.0)
ALK PHOS: 101 U/L (ref 38–126)
ALT: 11 U/L (ref 0–44)
AST: 20 U/L (ref 15–41)
Anion gap: 10 (ref 5–15)
BILIRUBIN TOTAL: 0.4 mg/dL (ref 0.3–1.2)
BUN: 11 mg/dL (ref 8–23)
CALCIUM: 9.3 mg/dL (ref 8.9–10.3)
CO2: 22 mmol/L (ref 22–32)
Chloride: 105 mmol/L (ref 98–111)
Creatinine, Ser: 0.78 mg/dL (ref 0.61–1.24)
GFR calc Af Amer: >60 mL/min (ref >60)
GLUCOSE: 82 mg/dL (ref 70–99)
POTASSIUM: 3.9 mmol/L (ref 3.5–5.1)
Sodium: 137 mmol/L (ref 135–145)
TOTAL PROTEIN: 7.6 g/dL (ref 6.5–8.1)

## 2018-02-26 LAB — TSH: TSH: 0.935 u[IU]/mL (ref 0.320–4.118)

## 2018-02-26 MED ORDER — IOHEXOL 300 MG/ML  SOLN
75.0000 mL | Freq: Once | INTRAMUSCULAR | Status: AC | PRN
  Administered 2018-02-26: 75 mL via INTRAVENOUS

## 2018-02-26 MED ORDER — SODIUM CHLORIDE 0.9 % IJ SOLN
INTRAMUSCULAR | Status: AC
  Filled 2018-02-26: qty 50

## 2018-02-27 DIAGNOSIS — Z0000 Encounter for general adult medical examination without abnormal findings: Secondary | ICD-10-CM | POA: Diagnosis not present

## 2018-02-27 DIAGNOSIS — Z23 Encounter for immunization: Secondary | ICD-10-CM | POA: Diagnosis not present

## 2018-02-27 DIAGNOSIS — E785 Hyperlipidemia, unspecified: Secondary | ICD-10-CM | POA: Diagnosis not present

## 2018-03-03 ENCOUNTER — Encounter: Payer: Self-pay | Admitting: Internal Medicine

## 2018-03-03 ENCOUNTER — Inpatient Hospital Stay (HOSPITAL_BASED_OUTPATIENT_CLINIC_OR_DEPARTMENT_OTHER): Payer: Medicare Other | Admitting: Internal Medicine

## 2018-03-03 ENCOUNTER — Telehealth: Payer: Self-pay

## 2018-03-03 VITALS — BP 114/73 | HR 75 | Temp 97.5°F | Resp 17 | Ht 72.0 in | Wt 148.6 lb

## 2018-03-03 DIAGNOSIS — C3491 Malignant neoplasm of unspecified part of right bronchus or lung: Secondary | ICD-10-CM

## 2018-03-03 DIAGNOSIS — Z85118 Personal history of other malignant neoplasm of bronchus and lung: Secondary | ICD-10-CM

## 2018-03-03 DIAGNOSIS — C3490 Malignant neoplasm of unspecified part of unspecified bronchus or lung: Secondary | ICD-10-CM

## 2018-03-03 DIAGNOSIS — Z79899 Other long term (current) drug therapy: Secondary | ICD-10-CM | POA: Diagnosis not present

## 2018-03-03 NOTE — Telephone Encounter (Signed)
Printed avs and calender of upcoming appointment. Per 10/15 los 
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
## 2018-03-18 ENCOUNTER — Encounter: Payer: Self-pay | Admitting: Primary Care

## 2018-03-18 ENCOUNTER — Ambulatory Visit (INDEPENDENT_AMBULATORY_CARE_PROVIDER_SITE_OTHER)
Admission: RE | Admit: 2018-03-18 | Discharge: 2018-03-18 | Disposition: A | Discharge status: Home / Self Care | Payer: Medicare Other | Source: Ambulatory Visit | Attending: Primary Care | Admitting: Primary Care

## 2018-03-18 ENCOUNTER — Ambulatory Visit (INDEPENDENT_AMBULATORY_CARE_PROVIDER_SITE_OTHER): Payer: Medicare Other | Admitting: Primary Care

## 2018-03-18 ENCOUNTER — Ambulatory Visit: Payer: Medicare Other | Admitting: Primary Care

## 2018-03-18 VITALS — BP 102/64 | HR 94 | Ht 72.0 in | Wt 141.4 lb

## 2018-03-18 DIAGNOSIS — J209 Acute bronchitis, unspecified: Secondary | ICD-10-CM

## 2018-03-18 DIAGNOSIS — J440 Chronic obstructive pulmonary disease with acute lower respiratory infection: Secondary | ICD-10-CM

## 2018-03-18 DIAGNOSIS — R0602 Shortness of breath: Secondary | ICD-10-CM | POA: Diagnosis not present

## 2018-03-18 MED ORDER — PREDNISONE 10 MG PO TABS: ORAL_TABLET | ORAL | 0 refills | Status: DC

## 2018-03-18 MED ORDER — LEVOFLOXACIN 500 MG PO TABS: 500.0000 mg | ORAL_TABLET | Freq: Every day | ORAL | 0 refills | Status: DC

## 2018-03-18 MED ORDER — IPRATROPIUM-ALBUTEROL 0.5-2.5 (3) MG/3ML IN SOLN: 3.0000 mL | Freq: Four times a day (QID) | RESPIRATORY_TRACT | 5 refills | Status: AC | PRN

## 2018-03-18 MED ORDER — ALBUTEROL SULFATE HFA 108 (90 BASE) MCG/ACT IN AERS: 2.0000 | INHALATION_SPRAY | RESPIRATORY_TRACT | 5 refills | Status: AC | PRN

## 2018-03-18 NOTE — Patient Instructions (Addendum)
Please take mucinex twice daily Delsym cough syrup as needed  Make sure to stay well hydrated and rest on your trip as much as possible  Keep up protein levels (boost/ensure shakes, protein bars, nuts, eggs, cheese, chicken, lean meats, beans ) Bring rescue inhaler and nebulizer machine on trip   CXR today  Will send in abx and steroid
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
## 2018-03-22 ENCOUNTER — Encounter: Payer: Self-pay | Admitting: Primary Care

## 2018-03-22 NOTE — Assessment & Plan Note (Signed)
-   COPD exacerbation d/t flu like illness  - CXR showed no definite acute abnormality. Clear left lung field. Stable post radiation changes seen in right upper lobe and perihilar region.  - RX Levaquin x 7 days and prednisone taper  - Advised mucinex and delsym cough syrup. Instructed patient to bring rescue inhaler and neb machine on trip with him  - Encouraged rest and fluids

## 2018-04-09 ENCOUNTER — Telehealth: Payer: Self-pay | Admitting: Primary Care

## 2018-04-09 NOTE — Telephone Encounter (Signed)
Certificate filled out for patient's behalf due to missed airline flight end of October 2019

## 2018-05-21 ENCOUNTER — Other Ambulatory Visit: Payer: Self-pay | Admitting: *Deleted

## 2018-05-21 DIAGNOSIS — C3491 Malignant neoplasm of unspecified part of right bronchus or lung: Secondary | ICD-10-CM

## 2018-05-22 ENCOUNTER — Inpatient Hospital Stay: Payer: Medicare Other | Attending: Internal Medicine

## 2018-05-22 ENCOUNTER — Other Ambulatory Visit: Payer: Self-pay | Admitting: Medical Oncology

## 2018-05-22 ENCOUNTER — Ambulatory Visit (HOSPITAL_COMMUNITY)
Admission: RE | Admit: 2018-05-22 | Discharge: 2018-05-22 | Disposition: A | Discharge status: Home / Self Care | Payer: Medicare Other | Source: Ambulatory Visit | Attending: Internal Medicine | Admitting: Internal Medicine

## 2018-05-22 DIAGNOSIS — C3490 Malignant neoplasm of unspecified part of unspecified bronchus or lung: Secondary | ICD-10-CM

## 2018-05-22 DIAGNOSIS — Z85118 Personal history of other malignant neoplasm of bronchus and lung: Secondary | ICD-10-CM | POA: Insufficient documentation

## 2018-05-22 DIAGNOSIS — C3491 Malignant neoplasm of unspecified part of right bronchus or lung: Secondary | ICD-10-CM

## 2018-05-26 ENCOUNTER — Encounter: Payer: Self-pay | Admitting: Internal Medicine

## 2018-05-26 ENCOUNTER — Inpatient Hospital Stay: Payer: Medicare Other

## 2018-05-26 ENCOUNTER — Telehealth: Payer: Self-pay

## 2018-05-26 ENCOUNTER — Inpatient Hospital Stay (HOSPITAL_BASED_OUTPATIENT_CLINIC_OR_DEPARTMENT_OTHER): Payer: Medicare Other | Admitting: Internal Medicine

## 2018-05-26 VITALS — BP 134/91 | HR 77 | Temp 97.5°F | Resp 18 | Ht 72.0 in | Wt 148.2 lb

## 2018-05-26 DIAGNOSIS — Z85118 Personal history of other malignant neoplasm of bronchus and lung: Secondary | ICD-10-CM | POA: Diagnosis present

## 2018-05-26 DIAGNOSIS — C3491 Malignant neoplasm of unspecified part of right bronchus or lung: Secondary | ICD-10-CM

## 2018-05-26 DIAGNOSIS — C3490 Malignant neoplasm of unspecified part of unspecified bronchus or lung: Secondary | ICD-10-CM

## 2018-05-26 DIAGNOSIS — J449 Chronic obstructive pulmonary disease, unspecified: Secondary | ICD-10-CM

## 2018-05-26 LAB — CMP (CANCER CENTER ONLY)
ALK PHOS: 85 U/L (ref 38–126)
ALT: 11 U/L (ref 0–44)
AST: 20 U/L (ref 15–41)
Albumin: 3.8 g/dL (ref 3.5–5.0)
Anion gap: 12 (ref 5–15)
BILIRUBIN TOTAL: 0.4 mg/dL (ref 0.3–1.2)
BUN: 11 mg/dL (ref 8–23)
CALCIUM: 9.6 mg/dL (ref 8.9–10.3)
CO2: 23 mmol/L (ref 22–32)
CREATININE: 0.83 mg/dL (ref 0.61–1.24)
Chloride: 102 mmol/L (ref 98–111)
GFR, Est AFR Am: >60 mL/min (ref >60)
Glucose, Bld: 68 mg/dL — ABNORMAL LOW (ref 70–99)
Potassium: 4.8 mmol/L (ref 3.5–5.1)
Sodium: 137 mmol/L (ref 135–145)
TOTAL PROTEIN: 8.3 g/dL — AB (ref 6.5–8.1)

## 2018-05-26 LAB — CBC WITH DIFFERENTIAL (CANCER CENTER ONLY)
ABS IMMATURE GRANULOCYTES: 0.05 10*3/uL (ref 0.00–0.07)
Basophils Absolute: 0.1 10*3/uL (ref 0.0–0.1)
Basophils Relative: 1 %
Eosinophils Absolute: 0.6 10*3/uL — ABNORMAL HIGH (ref 0.0–0.5)
Eosinophils Relative: 6 %
HCT: 44.2 % (ref 39.0–52.0)
Hemoglobin: 14.7 g/dL (ref 13.0–17.0)
Immature Granulocytes: 1 %
Lymphocytes Relative: 16 %
Lymphs Abs: 1.4 10*3/uL (ref 0.7–4.0)
MCH: 30 pg (ref 26.0–34.0)
MCHC: 33.3 g/dL (ref 30.0–36.0)
MCV: 90.2 fL (ref 80.0–100.0)
Monocytes Absolute: 0.7 10*3/uL (ref 0.1–1.0)
Monocytes Relative: 8 %
NEUTROS ABS: 5.9 10*3/uL (ref 1.7–7.7)
Neutrophils Relative %: 68 %
PLATELETS: 261 10*3/uL (ref 150–400)
RBC: 4.9 MIL/uL (ref 4.22–5.81)
RDW: 13.2 % (ref 11.5–15.5)
WBC Count: 8.7 10*3/uL (ref 4.0–10.5)
nRBC: 0 % (ref 0.0–0.2)

## 2018-05-26 NOTE — Progress Notes (Signed)
[place] Telephone:(336) [phone]   Fax:(336) [phone]  OFFICE PROGRESS NOTE  [name], MD [place] [id number]  DIAGNOSIS: Stage IIIA (T4, N1, M0) non-small cell lung cancer, favoring adenocarcinoma presented with large right lower lobe lung mass in addition to right hilar lymphadenopathy and right upper lobe apical pulmonary nodule diagnosed in July 2018.  Guardant 360 testing: negative EGFR, ALK, ROS 1 and BRAF mutations.  PRIOR THERAPY:  1) Concurrent chemoradiation with weekly carboplatin for AUC of 2 and paclitaxel 45 MG/M2. First dose 12/30/2016.status post 7 cycles.  Last dose was given 02/10/2017 with partial response. 2) Consolidation immunotherapy with Imfinzi (Durvalumab) 10 mg/KG every 2 weeks, first dose April 08, 2017.  Status post 6 cycles.  He received cycle 3 of this treatment in [place].  His treatment was discontinued secondary to immunotherapy mediated skin rash.  CURRENT THERAPY: Observation.  INTERVAL HISTORY: Harry Allen 78 y.o. male returns to the clinic today for follow-up visit accompanied by his wife.  The patient is feeling fine today with no concerning complaints.  He has been in observation for several months now and feeling fine.  He denied having any recent chest pain, shortness of breath but has mild cough with no hemoptysis.  He has no fever or chills.  He denied having any recent weight loss or night sweats.  The patient had repeat CT scan of the chest performed recently and he is here for evaluation and discussion of his discuss results.  MEDICAL HISTORY: Past Medical History:  Diagnosis Date  . Adenocarcinoma of right lung, stage 3 ([place]) 12/19/2016  . Cancer ([place])    lung cancer  . COPD (chronic obstructive pulmonary disease) ([place])   . H/O hernia repair   . Sleep apnea     ALLERGIES:  is allergic to no known allergies.  MEDICATIONS:  Current Outpatient Medications  Medication Sig Dispense  Refill  . albuterol (PROVENTIL HFA;VENTOLIN HFA) 108 (90 Base) MCG/ACT inhaler Inhale 2 puffs into the lungs every 4 (four) hours as needed for wheezing or shortness of breath. 1 Inhaler 5  . ipratropium-albuterol (DUONEB) 0.5-2.5 (3) MG/3ML SOLN Take 3 mLs by nebulization every 6 (six) hours as needed. And as needed 360 mL 5  . levofloxacin (LEVAQUIN) 500 MG tablet Take 1 tablet (500 mg total) by mouth daily. 7 tablet 0  . meloxicam (MOBIC) 15 MG tablet Take 1 tablet by mouth as needed.    . predniSONE (DELTASONE) 10 MG tablet Take 4 tabs po daily x 3 days; then 3 tabs daily x3 days; then 2 tabs daily x3 days; then 1 tab daily x 3 days; then stop 30 tablet 0  . Tiotropium Bromide Monohydrate (SPIRIVA RESPIMAT) 2.5 MCG/ACT AERS Inhale 2 puffs into the lungs daily. 1 Inhaler 5   No current facility-administered medications for this visit.     SURGICAL HISTORY:  Past Surgical History:  Procedure Laterality Date  . APPENDECTOMY    . HERNIA REPAIR    . HYDROCELE EXCISION     on neck; 1968  . VIDEO BRONCHOSCOPY WITH ENDOBRONCHIAL ULTRASOUND N/A 12/09/2016   Procedure: VIDEO BRONCHOSCOPY WITH ENDOBRONCHIAL ULTRASOUND;  Surgeon: [name], MD;  Location: [facility] OR;  Service: Thoracic;  Laterality: N/A;    REVIEW OF SYSTEMS:  A comprehensive review of systems was negative except for: Respiratory: positive for cough   PHYSICAL EXAMINATION: General appearance: alert, cooperative and no distress Head: Normocephalic, without obvious abnormality, atraumatic  Neck: no adenopathy, no JVD, supple, symmetrical, trachea midline and thyroid not enlarged, symmetric, no tenderness/mass/nodules Lymph nodes: Cervical, supraclavicular, and axillary nodes normal. Resp: clear to auscultation bilaterally Back: symmetric, no curvature. ROM normal. No CVA tenderness. Cardio: regular rate and rhythm, S1, S2 normal, no murmur, click, rub or gallop GI: soft, non-tender; bowel sounds normal; no masses,  no  organomegaly Extremities: extremities normal, atraumatic, no cyanosis or edema    ECOG PERFORMANCE STATUS: 1 - Symptomatic but completely ambulatory  Blood pressure (!) 134/91, pulse 77, temperature (!) 97.5 F (36.4 C), temperature source Oral, resp. rate 18, height 6' (1.829 m), weight 148 lb 3.2 oz (67.2 kg), SpO2 91 %.  LABORATORY DATA: Lab Results  Component Value Date   WBC 8.7 05/26/2018   HGB 14.7 05/26/2018   HCT 44.2 05/26/2018   MCV 90.2 05/26/2018   PLT 261 05/26/2018      Chemistry      Component Value Date/Time   NA 137 02/26/2018 1304   NA 137 05/16/2017 1340   K 3.9 02/26/2018 1304   K 4.2 05/16/2017 1340   CL 105 02/26/2018 1304   CO2 22 02/26/2018 1304   CO2 26 05/16/2017 1340   BUN 11 02/26/2018 1304   BUN 10.9 05/16/2017 1340   CREATININE 0.78 02/26/2018 1304   CREATININE 0.87 07/15/2017 1448   CREATININE 0.8 05/16/2017 1340      Component Value Date/Time   CALCIUM 9.3 02/26/2018 1304   CALCIUM 9.3 05/16/2017 1340   ALKPHOS 101 02/26/2018 1304   ALKPHOS 96 05/16/2017 1340   AST 20 02/26/2018 1304   AST 17 07/15/2017 1448   AST 19 05/16/2017 1340   ALT 11 02/26/2018 1304   ALT 14 07/15/2017 1448   ALT 12 05/16/2017 1340   BILITOT 0.4 02/26/2018 1304   BILITOT 0.4 07/15/2017 1448   BILITOT 0.30 05/16/2017 1340       RADIOGRAPHIC STUDIES: Ct Chest Wo Contrast  Result Date: 05/22/2018 CLINICAL DATA:  Lung cancer diagnosed in 2018. Radiation therapy, chemotherapy and immunotherapy completed. Cough and shortness of breath. EXAM: CT CHEST WITHOUT CONTRAST TECHNIQUE: Multidetector CT imaging of the chest was performed following the standard protocol without IV contrast. COMPARISON:  PET-CT 12/05/2016. Multiple prior chest CTs ranging from 03/12/2017 through 02/26/2018. FINDINGS: Cardiovascular: Three-vessel coronary artery atherosclerosis with slightly lesser involvement of the thoracic aorta and great vessels. The heart size is normal. There is no  pericardial effusion. Mediastinum/Nodes: There are no enlarged mediastinal, hilar or axillary lymph nodes.Hilar assessment is limited by the lack of intravenous contrast, although the hilar contours appear unchanged. The thyroid gland, trachea and esophagus demonstrate no significant findings. Lungs/Pleura: There is no pleural effusion. Moderate to severe centrilobular and paraseptal emphysema. Stable biapical scarring. There are stable radiation changes in the right perihilar region with hilar distortion, bronchiectasis and perihilar scarring. No recurrent mass lesion or suspicious pulmonary nodularity. Scattered tiny pulmonary nodules are stable, including a subpleural one in the left lower lobe on image 133/5. Upper abdomen: The visualized upper abdomen appears stable without suspicious findings. There is a small cyst in the upper pole of the right kidney. Musculoskeletal/Chest wall: There is no chest wall mass or suspicious osseous finding. Old rib fractures on the left. IMPRESSION: 1. Stable radiation changes in the right perihilar region. 2. No evidence of local recurrence or metastatic disease. 3. Aortic Atherosclerosis (ICD10-I70.0) and Emphysema (ICD10-J43.9). Electronically Signed   By: Richardean Sale M.D.   On: 05/22/2018 16:50  ASSESSMENT AND PLAN: This is a very pleasant 78 years old African-American male recently diagnosed with a stage IIIA (T4, N1, M0) non-small cell lung cancer, favoring adenocarcinoma diagnosed in July 2018. The patient underwent a course of concurrent chemoradiation with weekly carboplatin and paclitaxel status post 7 cycles and tolerated this treatment fairly well.  He has partial response to this treatment. He is currently undergoing consolidation treatment with immunotherapy with Imfinzi (Durvalumab) status post 6 cycles.  He received some of his treatment in [place]. His treatment was discontinued secondary to development of significant skin rash on the lower  abdomen and back as well as lower extremities and this was secondary to a flare of his psoriasis with the immunotherapy. The patient is currently on observation and he is feeling fine. He had a repeat CT scan of the chest performed recently.  I personally and independently reviewed the scans and discussed the results with the patient and his wife.  His scan showed no concerning findings for disease recurrence or progression. I recommended for the patient to continue on observation with repeat CT scan of the chest in 3 months. He was advised to call immediately if he has any concerning symptoms in the interval. The patient voices understanding of current disease status and treatment options and is in agreement with the current care plan. All questions were answered. The patient knows to call the clinic with any problems, questions or concerns. We can certainly see the patient much sooner if necessary.  Disclaimer: This note was dictated with voice recognition software. Similar sounding words can inadvertently be transcribed and may not be corrected upon review.

## 2018-05-26 NOTE — Telephone Encounter (Signed)
Printed avs and calender of upcoming appointment. Per 1/7 los 

## 2018-06-03 ENCOUNTER — Telehealth: Payer: Self-pay | Admitting: Internal Medicine

## 2018-06-03 NOTE — Telephone Encounter (Signed)
Spoke with patient wife about rescheduling MD appt and lab. Rescheduled appt per patient spouse request, I also informed the patient spouse that she will have to reschedule the CT scan appt from 04/10 to 04/03, since we moved the lab appt to 04/03 and the MD appt to 04/07.  Patient aware of scheduled appts.

## 2018-06-09 ENCOUNTER — Telehealth: Payer: Self-pay | Admitting: Medical Oncology

## 2018-06-09 NOTE — Telephone Encounter (Signed)
"  Left arm numb, dizzy, off balance and temperature 99.60f"  Pt asking what should he do. He is in  [place] ,[place]. I told him to go to local ED for evaluation .

## 2018-06-10 ENCOUNTER — Telehealth: Payer: Self-pay | Admitting: Medical Oncology

## 2018-06-10 ENCOUNTER — Telehealth: Payer: Self-pay | Admitting: Internal Medicine

## 2018-06-10 NOTE — Telephone Encounter (Signed)
Recent office note ,labs and scan faxed to [facility] in [place]. Pt has appt there on Friday . Fax sent to our  [facility] to send notes from 2019 and message sent to [facility] to send scans and images to [facility].

## 2018-06-10 NOTE — Telephone Encounter (Signed)
Faxed medical records to [facility], Release [phone]

## 2018-07-23 ENCOUNTER — Telehealth: Payer: Self-pay | Admitting: Internal Medicine

## 2018-07-23 NOTE — Telephone Encounter (Signed)
Pal moved appt from 04/07 to 04/08. Called to confirm with patient, left a vm. Schedule will be mailed.

## 2018-07-24 ENCOUNTER — Telehealth: Payer: Self-pay | Admitting: Internal Medicine

## 2018-07-24 NOTE — Telephone Encounter (Signed)
Left message- called patient per 3/6 sch message - pt needed a afternoon appt not on 4/8 - r/s and left message with appt date and time

## 2018-08-19 ENCOUNTER — Telehealth: Payer: Self-pay | Admitting: Internal Medicine

## 2018-08-19 NOTE — Telephone Encounter (Signed)
Cancelled apt per 3/31 sch message - unable to reach patient . Left message for patient to call back if reschedule is needed.

## 2018-08-21 ENCOUNTER — Other Ambulatory Visit: Payer: Medicare Other

## 2018-08-21 ENCOUNTER — Ambulatory Visit (HOSPITAL_COMMUNITY): Admission: RE | Admit: 2018-08-21 | Payer: Medicare Other | Source: Ambulatory Visit

## 2018-08-24 ENCOUNTER — Ambulatory Visit: Payer: Medicare Other | Admitting: Physician Assistant

## 2018-08-25 ENCOUNTER — Ambulatory Visit: Payer: Medicare Other | Admitting: Internal Medicine

## 2018-08-26 ENCOUNTER — Ambulatory Visit: Payer: Medicare Other | Admitting: Internal Medicine

## 2018-08-28 ENCOUNTER — Ambulatory Visit (HOSPITAL_COMMUNITY): Payer: Medicare Other

## 2018-08-28 ENCOUNTER — Other Ambulatory Visit: Payer: Medicare Other

## 2018-09-01 ENCOUNTER — Ambulatory Visit: Payer: Medicare Other | Admitting: Internal Medicine

## 2018-09-05 ENCOUNTER — Other Ambulatory Visit: Payer: Self-pay | Admitting: Nurse Practitioner

## 2018-09-15 ENCOUNTER — Telehealth: Payer: Self-pay | Admitting: Medical Oncology

## 2018-09-15 NOTE — Telephone Encounter (Signed)
Wants to get scan done in [place]. He is not going to be flying back to [place] anytime soon. . I told pt to contact his oncology doctor in [place].

## 2018-09-21 ENCOUNTER — Telehealth: Payer: Self-pay | Admitting: Medical Oncology

## 2018-09-21 NOTE — Telephone Encounter (Signed)
Respiratory- Jodie left VM that pt is having more difficulty breathing . He missed CT scan in April due to being in [place].LVM for [name] last week and Jodie today that pt needs to see provider in [place] re getting a CT scan there. She said he will not be back in [place] until late June or July due to [place].Marland Kitchen

## 2019-05-24 IMAGING — PT NM PET TUM IMG INITIAL (PI) SKULL BASE T - THIGH
1 of 8 series · 1 of 25 positions shown · non-contrast
Comparison: Chest CT dated 10/22/2016

CLINICAL DATA: Initial treatment strategy for lung nodules.

EXAM:
NUCLEAR MEDICINE PET SKULL BASE TO THIGH
TECHNIQUE: 7.6 mCi F-18 FDG was injected intravenously. Full-ring PET imaging
was performed from the skull base to thigh after the radiotracer. CT
data was obtained and used for attenuation correction and anatomic
localization.
Injection was right antecubital.
FASTING BLOOD GLUCOSE:  Value: 89 mg/dl

[Series 4: ct sk_thigh 5.0 b31f · axial · 5.0mm · 0.98mm/px · 1 of 214 slices shown]
[im 214/214  brain]
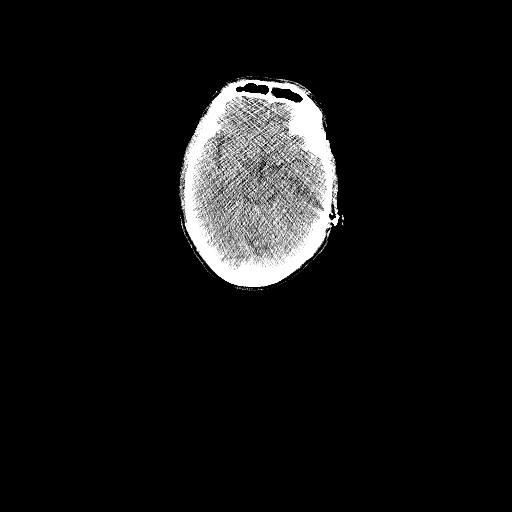

[1 of 25 positions shown; findings below may reference images not displayed]

FINDINGS: NECK

No hypermetabolic lymph nodes in the neck.

Atherosclerotic calcification of the carotid bulbs.

Chronic left frontal and chronic bilateral ethmoid and maxillary
sinusitis.

CHEST

Partially cavitary 5.9 by 4.8 cm right lower lobe infrahilar mass
extending up towards the hilum, maximum SUV 15.1. By my measurement,
this mass previously measured 5.2 by 4.8 cm on 10/22/2016.

Right hilar lymph node maximum SUV 13.6.

Apical segment right upper lobe pulmonary nodule measures 1.2 by
cm on image [DATE] with maximum SUV 9.7.

Severe primarily centrilobular emphysema. Mild postobstructive
pneumonitis in the right lower lobe. Bilateral mild airway
thickening. Mild chronic peripheral interstitial accentuation.

Coronary, aortic arch, and branch vessel atherosclerotic vascular
disease.

ABDOMEN/PELVIS

No abnormal hypermetabolic activity within the liver, pancreas,
adrenal glands, or spleen. No hypermetabolic lymph nodes in the
abdomen or pelvis.

Right kidney upper pole photopenic cyst.

Aortoiliac atherosclerotic vascular disease.

SKELETON

No focal hypermetabolic activity to suggest skeletal metastasis.

Focal hypermetabolic activity in the right antecubital region is
believed to be injection related. I have confirmed that this was the
site of injection.

Levoconvex lumbar scoliosis.  The dextroconvex thoracic scoliosis.
IMPRESSION: 1. Large hypermetabolic right lower lobe mass, maximum SUV 15.1.
Hypermetabolic right hilar lymph node and a hypermetabolic apical
segment right upper lobe pulmonary nodule indicative of ipsilateral
hilar and pulmonary metastatic disease. No contralateral or extra
thoracic metastatic disease identified. Assuming non-small cell lung
cancer this represents T4 N1 M0 disease (stage IIIA).
2. Chronic paranasal sinusitis.
3. Aortic Atherosclerosis (YWAHW-WP2.2) and Emphysema (YWAHW-TG7.U).
Coronary atherosclerosis.
4. S-shaped thoracolumbar scoliosis.

## 2019-05-28 IMAGING — CR DG CHEST 2V
2 series · 2 of 2 positions shown · non-contrast
Comparison: PET-CT 12/05/2016.  Chest CT 10/22/2016.

CLINICAL DATA: Right lung mass.  COPD.

EXAM:
CHEST  2 VIEW

[w chest pa]
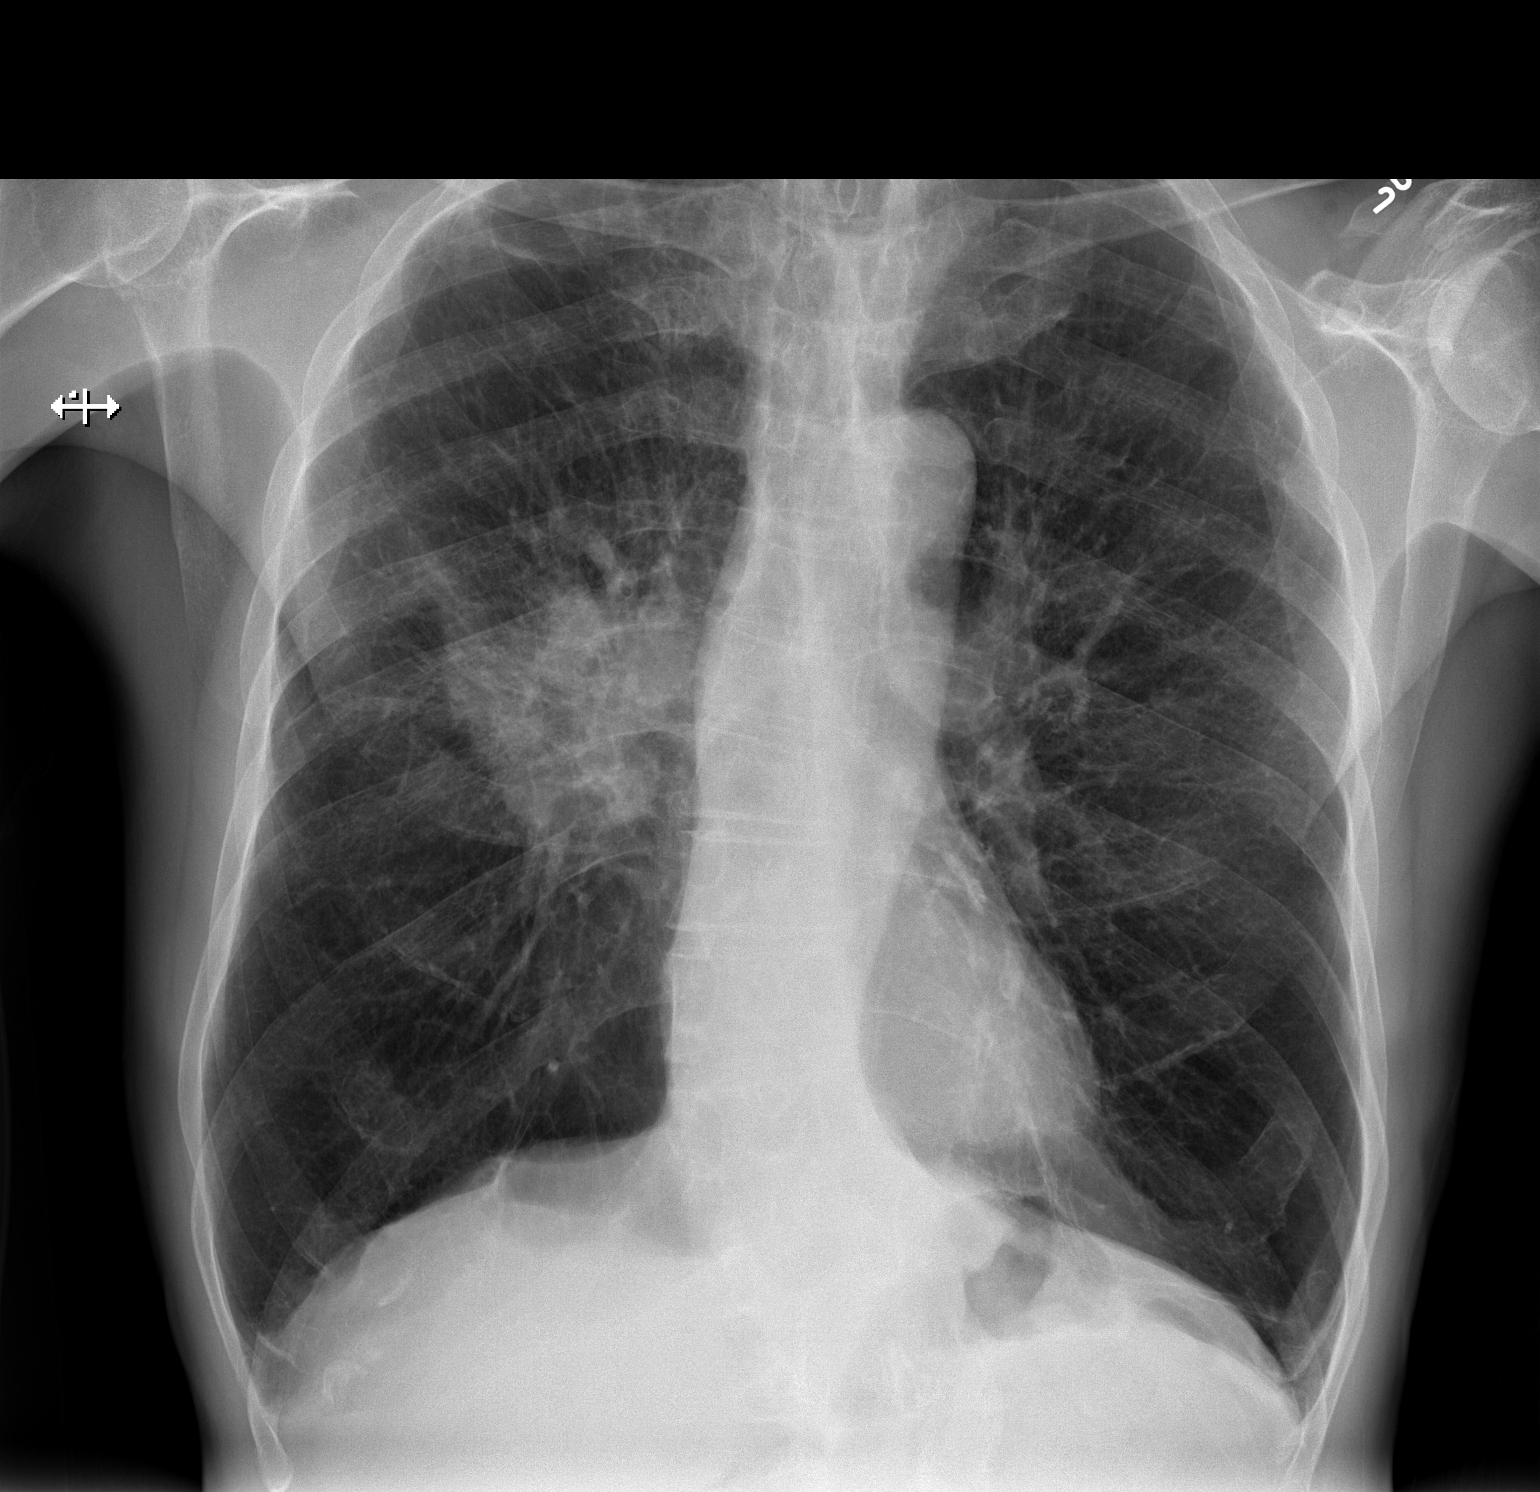

[w chest lat]
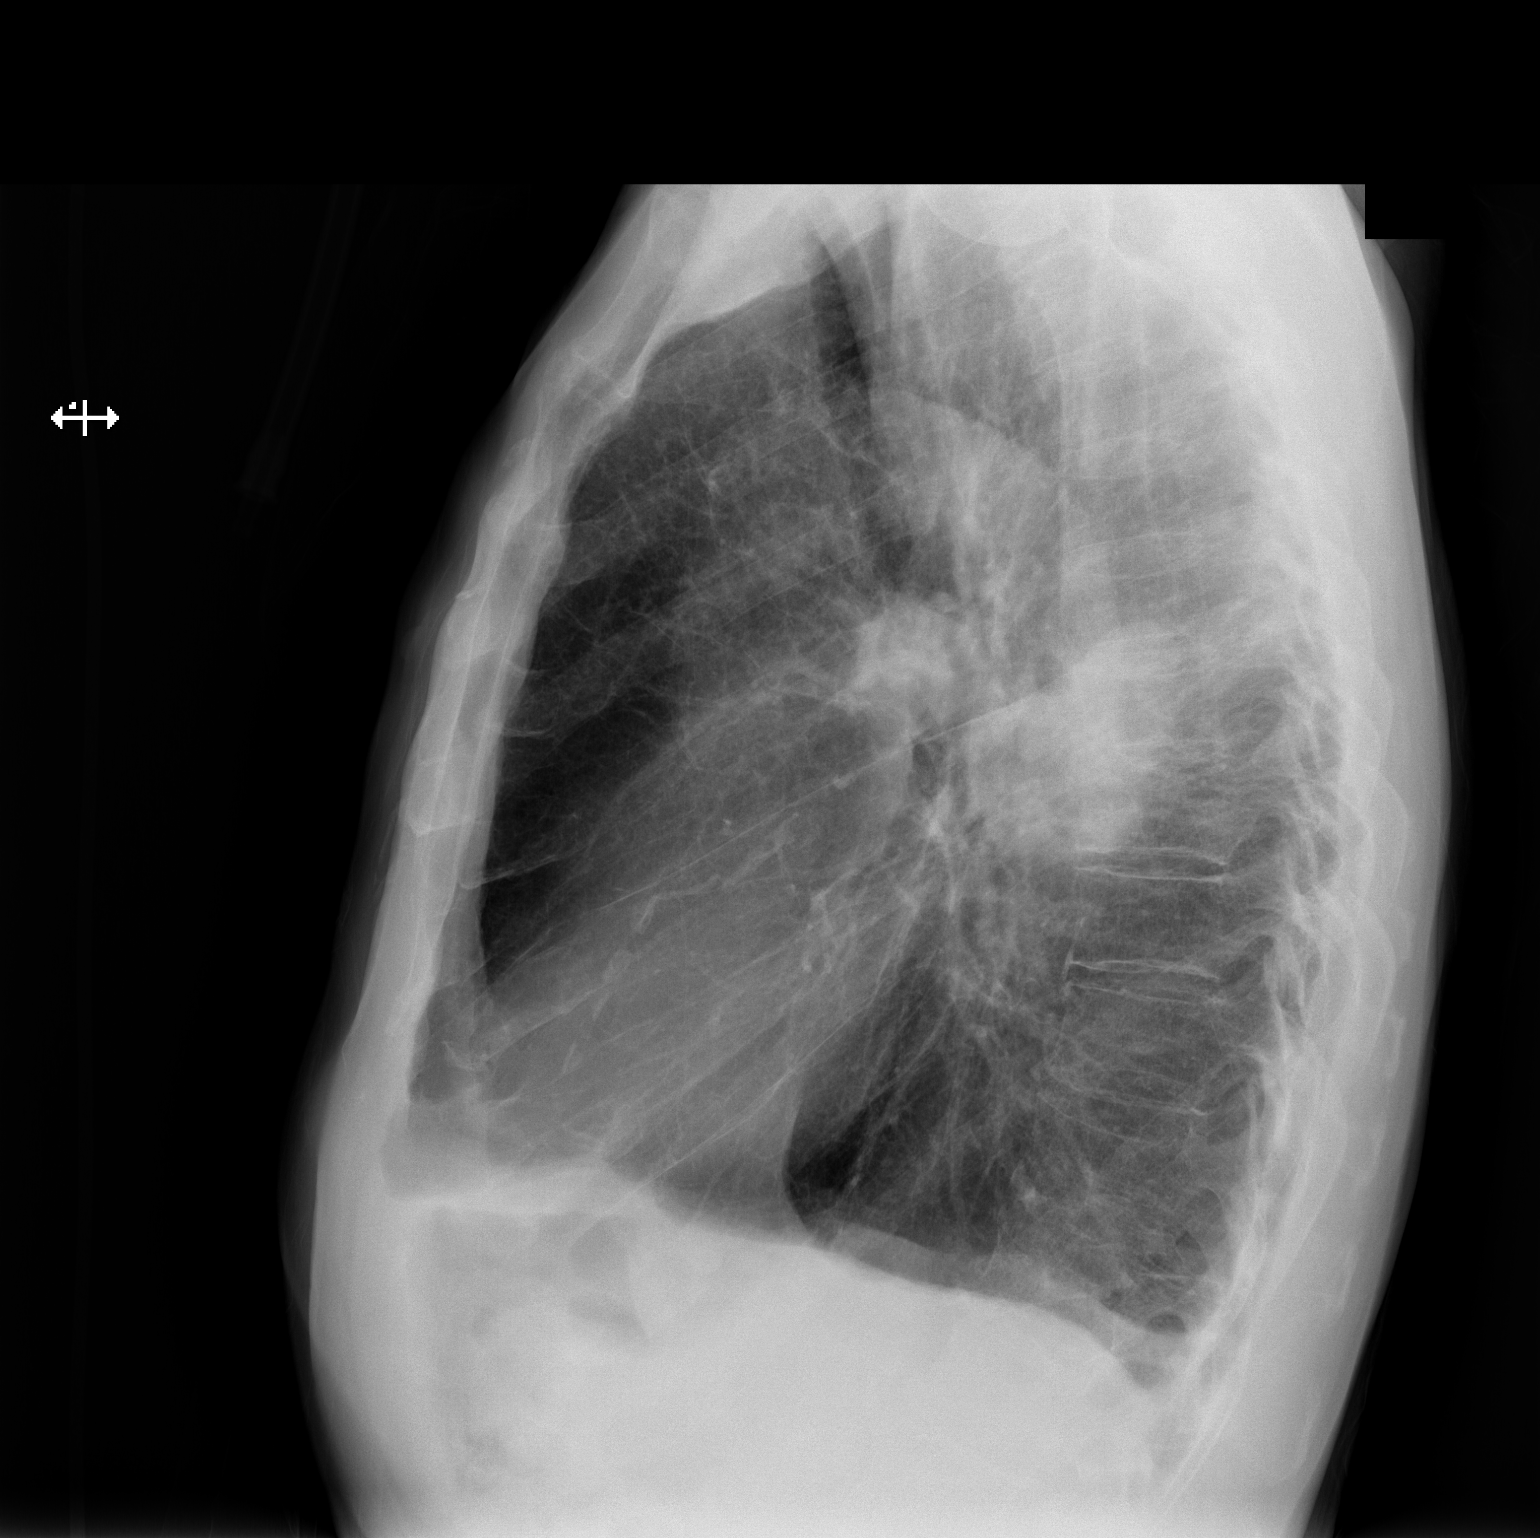

[2 of 2 positions shown; findings below may reference images not displayed]

FINDINGS: Mediastinum is stable. Heart size stable. Large right lung mass
again noted. No pleural effusion or pneumothorax. COPD. Thoracic
spine scoliosis.
IMPRESSION: 1. Large right lung mass again noted. No acute abnormality
identified.

2. COPD .

## 2019-09-08 ENCOUNTER — Ambulatory Visit (INDEPENDENT_AMBULATORY_CARE_PROVIDER_SITE_OTHER): Payer: Medicare Other | Admitting: Primary Care

## 2019-09-08 ENCOUNTER — Other Ambulatory Visit: Payer: Self-pay

## 2019-09-08 ENCOUNTER — Encounter: Payer: Self-pay | Admitting: Primary Care

## 2019-09-08 DIAGNOSIS — J449 Chronic obstructive pulmonary disease, unspecified: Secondary | ICD-10-CM | POA: Diagnosis not present

## 2019-09-08 MED ORDER — SPIRIVA RESPIMAT 2.5 MCG/ACT IN AERS: 2.0000 | INHALATION_SPRAY | Freq: Every day | RESPIRATORY_TRACT | 11 refills | Status: AC

## 2019-09-08 NOTE — Progress Notes (Signed)
@Patient  ID: Harry Allen, male    DOB: 1940-12-22, 79 y.o.   MRN: [id number]  Chief Complaint  Patient presents with  . Follow-up    Gets tired when walking, sob with distance and stairs.    Referring provider: [name], MD  HPI: 79 year old male, former smoker. He smoked 1 pack per day from the age of [age] (53+ pack year hx). Quit in 2015. PMH COPD, stage 3 adenocarcinoma of right lung, OSA. Patient [name], last seen 02/09/18.   Previous [facility] pulmonary encounters: ROV 02/09/18 - 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  He is currently on observation.  He has documented sleep apnea but is not on CPAP.  He was seen here 11/19/2017 with an apparent acute exacerbation of his COPD.  At the time he was not on scheduled bronchodilators but agreed to start Spiriva.  Pulmonary function testing was ordered but was never done. He states that he is unsure whether the spiriva has helped him. His wife believes that he has improved on it. He is coughing up less mucous, having less wheeze. He uses Duoneb about 2x a day. Most recent CT scan of the chest 11/07/2017 reviewed by me.  This shows no evidence of local recurrence. Some perihilar consolidation and bronchiectatic change in a linear pattern associated with his radiation.  03/22/2018 Patient presents today for acute visit with complaints of cough with mucus production. Associated runny nose. Coughing up green/yellow mucus. Taking Delsym cough syrup and using neb every 6 hours. States that he had flu like symptoms 1.5 weeks ago. Reported chills, aches, weakness, sore throat and runny nose. Lost 7 lbs. Did not have much of an appetite, took in about half his normal caloric intake. Feels better, thinks he's over acute infection. Wifes states that he is "much, much, much better". Leaving tomorrow to speak at  [employer] for work. He is then going to [place] on November 2nd with his wife to stay at their apartment until Nov 23rd.    09/08/2019 Patient presents today for general follow-up. It was been over two years since he was last seen in our office. Accompanied by his wife. Reports increased shortness of breath when walking long distances and with stairs. He has not been consistently taking spiriva for the last 4-5 months and needs refill of his medication. He is walking every day with his wife and finds that when he does take this medication helpful. Reports that he has slowed down a lot. Notices some speaking trouble and a stutter. He has had this since childhood but feels his breathing affects his speech. No PFTs on file, he was unable to complete PFTs in the past. No active URI symptoms, cough, chest congestion, wheezing.    Imaging: CT chest 05/22/18- [facility] radiation changes right perihilar region. No evidence of recurrence of metastatic disease.   Allergies  Allergen Reactions  . No Known Allergies     Immunization History  Administered Date(s) Administered  . Influenza, High Dose Seasonal PF 02/27/2018, 02/18/2019  . Influenza-Unspecified 02/26/2016, 05/08/2017  . PFIZER SARS-COV-2 Vaccination 06/10/2019, 07/01/2019  . Pneumococcal Conjugate-13 05/22/2017    Past Medical History:  Diagnosis Date  . Adenocarcinoma of right lung, stage 3 ([facility]) 12/19/2016  . Cancer ([place])    lung cancer  . COPD (chronic obstructive pulmonary disease) ([place])   . H/O hernia repair   .  Sleep apnea     Tobacco History: Social History   Tobacco Use  Smoking Status Former Smoker  . Packs/day: 1.00  . Years: 50.00  . Pack years: 50.00  . Types: Cigarettes  . Quit date: 11/19/2013  . Years since quitting: 5.8  Smokeless Tobacco Never Used   Counseling given: Not Answered   Outpatient Medications Prior to Visit  Medication Sig Dispense Refill  . ipratropium-albuterol (DUONEB) 0.5-2.5 (3)  MG/3ML SOLN Take 3 mLs by nebulization every 6 (six) hours as needed. And as needed 360 mL 5  . Tiotropium Bromide Monohydrate (SPIRIVA RESPIMAT) 2.5 MCG/ACT AERS Inhale 2 puffs into the lungs daily. 1 Inhaler 5  . albuterol (PROVENTIL HFA;VENTOLIN HFA) 108 (90 Base) MCG/ACT inhaler Inhale 2 puffs into the lungs every 4 (four) hours as needed for wheezing or shortness of breath. (Patient not taking: Reported on 05/26/2018) 1 Inhaler 5   No facility-administered medications prior to visit.   Review of Systems  Review of Systems  Constitutional: Positive for fatigue.  Respiratory: Positive for shortness of breath. Negative for cough and wheezing.   Cardiovascular: Negative.     Physical Exam  BP 130/70 (BP Location: Left Arm, Cuff Size: Normal)   Pulse 75   Temp 97.7 F (36.5 C) (Temporal)   Ht 6\' 1"  (1.854 m)   Wt 149 lb 6.4 oz (67.8 kg)   SpO2 95%   BMI 19.71 kg/m  Physical Exam Constitutional:      Appearance: Normal appearance.  HENT:     Head: Normocephalic and atraumatic.  Cardiovascular:     Rate and Rhythm: Normal rate.  Pulmonary:     Effort: Pulmonary effort is normal. No respiratory distress.  Neurological:     Mental Status: He is alert.  Psychiatric:        Mood and Affect: Mood normal.        Behavior: Behavior normal.        Thought Content: Thought content normal.        Judgment: Judgment normal.      Lab Results:  CBC    Component Value Date/Time   WBC 8.7 05/26/2018 1514   WBC 6.8 02/26/2018 1304   RBC 4.90 05/26/2018 1514   HGB 14.7 05/26/2018 1514   HGB 12.2 (L) 05/16/2017 1340   HCT 44.2 05/26/2018 1514   HCT 36.9 (L) 05/16/2017 1340   PLT 261 05/26/2018 1514   PLT 320 05/16/2017 1340   MCV 90.2 05/26/2018 1514   MCV 94.7 05/16/2017 1340   MCH 30.0 05/26/2018 1514   MCHC 33.3 05/26/2018 1514   RDW 13.2 05/26/2018 1514   RDW 14.7 (H) 05/16/2017 1340   LYMPHSABS 1.4 05/26/2018 1514   LYMPHSABS 0.8 (L) 05/16/2017 1340   MONOABS 0.7  05/26/2018 1514   MONOABS 0.8 05/16/2017 1340   EOSABS 0.6 (H) 05/26/2018 1514   EOSABS 0.3 05/16/2017 1340   BASOSABS 0.1 05/26/2018 1514   BASOSABS 0.1 05/16/2017 1340    BMET    Component Value Date/Time   NA 137 05/26/2018 1514   NA 137 05/16/2017 1340   K 4.8 05/26/2018 1514   K 4.2 05/16/2017 1340   CL 102 05/26/2018 1514   CO2 23 05/26/2018 1514   CO2 26 05/16/2017 1340   GLUCOSE 68 (L) 05/26/2018 1514   GLUCOSE 80 05/16/2017 1340   BUN 11 05/26/2018 1514   BUN 10.9 05/16/2017 1340   CREATININE 0.83 05/26/2018 1514   CREATININE 0.8 05/16/2017 1340   CALCIUM  9.6 05/26/2018 1514   CALCIUM 9.3 05/16/2017 1340   GFRNONAA >60 05/26/2018 1514   GFRAA >60 05/26/2018 1514    BNP No results found for: BNP  ProBNP No results found for: PROBNP  Imaging: No results found.   Assessment & Plan:   COPD (chronic obstructive pulmonary disease) ([facility]) - Former smoker, 53 pack year hx quit in 2018. Emphysematous changes on CT.  - Unclear degree of obstruction. No PFTs on file, patient was unable to complete d/t poor effort in the past - Increased dyspnea on exertion  - Resume Tiotropium bromide (Spiriva respimat) 2.74mcg two puffs once daily  - Consider office Spirometry  - FU in 4 weeks, if no improvement in breathing consider [place], NP 09/08/2019

## 2019-09-08 NOTE — Assessment & Plan Note (Addendum)
-   Former smoker, 53 pack year hx quit in 2018. Emphysematous changes on CT.  - Unclear degree of obstruction. No PFTs on file, patient was unable to complete d/t poor effort in the past - Increased dyspnea on exertion  - Resume Tiotropium bromide (Spiriva respimat) 2.8mcg two puffs once daily  - Consider office Spirometry  - FU in 4 weeks, if no improvement in breathing consider Stiolto Respimat

## 2019-09-08 NOTE — Patient Instructions (Addendum)
Resume Spiriva (tiotriopium bromide)- take two puffs once daily in the morning  Follow-up: 4 weeks with Beth to see how you are doing if not back to baseline we can try you on medication called Sitolto (tiotriopium bromide- olodaterol)   COPD and Physical Activity Chronic obstructive pulmonary disease (COPD) is a long-term (chronic) condition that affects the lungs. COPD is a general term that can be used to describe many different lung problems that cause lung swelling (inflammation) and limit airflow, including chronic bronchitis and emphysema. The main symptom of COPD is shortness of breath, which makes it harder to do even simple tasks. This can also make it harder to exercise and be active. Talk with your health care provider about treatments to help you breathe better and actions you can take to prevent breathing problems during physical activity. What are the benefits of exercising with COPD? Exercising regularly is an important part of a healthy lifestyle. You can still exercise and do physical activities even though you have COPD. Exercise and physical activity improve your shortness of breath by increasing blood flow (circulation). This causes your heart to pump more oxygen through your body. Moderate exercise can improve your:  Oxygen use.  Energy level.  Shortness of breath.  Strength in your breathing muscles.  Heart health.  Sleep.  Self-esteem and feelings of self-worth.  Depression, stress, and anxiety levels. Exercise can benefit everyone with COPD. The severity of your disease may affect how hard you can exercise, especially at first, but everyone can benefit. Talk with your health care provider about how much exercise is safe for you, and which activities and exercises are safe for you. What actions can I take to prevent breathing problems during physical activity?  Sign up for a pulmonary rehabilitation program. This type of program may include: ? Education about  lung diseases. ? Exercise classes that teach you how to exercise and be more active while improving your breathing. This usually involves:  Exercise using your lower extremities, such as a stationary bicycle.  About 30 minutes of exercise, 2 to 5 times per week, for 6 to 12 weeks  Strength training, such as push ups or leg lifts. ? Nutrition education. ? Group classes in which you can talk with others who also have COPD and learn ways to manage stress.  If you use an oxygen tank, you should use it while you exercise. Work with your health care provider to adjust your oxygen for your physical activity. Your resting flow rate is different from your flow rate during physical activity.  While you are exercising: ? Take slow breaths. ? Pace yourself and do not try to go too fast. ? Purse your lips while breathing out. Pursing your lips is similar to a kissing or whistling position. ? If doing exercise that uses a quick burst of effort, such as weight lifting:  Breathe in before starting the exercise.  Breathe out during the hardest part of the exercise (such as raising the weights). Where to find support You can find support for exercising with COPD from:  Your health care provider.  A pulmonary rehabilitation program.  Your local health department or community health programs.  Support groups, online or in-person. Your health care provider may be able to recommend support groups. Where to find more information You can find more information about exercising with COPD from:  American Lung Association: ClassInsider.se.  COPD Foundation: https://www.rivera.net/. Contact a health care provider if:  Your symptoms get worse.  You have  chest pain.  You have nausea.  You have a fever.  You have trouble talking or catching your breath.  You want to start a new exercise program or a new activity. Summary  COPD is a general term that can be used to describe many different lung problems that  cause lung swelling (inflammation) and limit airflow. This includes chronic bronchitis and emphysema.  Exercise and physical activity improve your shortness of breath by increasing blood flow (circulation). This causes your heart to provide more oxygen to your body.  Contact your health care provider before starting any exercise program or new activity. Ask your health care provider what exercises and activities are safe for you. This information is not intended to replace advice given to you by your health care provider. Make sure you discuss any questions you have with your health care provider. Document Revised: 08/26/2018 Document Reviewed: 05/29/2017 Elsevier Patient Education  2020 [employer].

## 2019-10-15 ENCOUNTER — Ambulatory Visit: Payer: Medicare Other | Admitting: Primary Care

## 2021-05-23 ENCOUNTER — Ambulatory Visit: Payer: Medicare Other | Admitting: Primary Care

## 2021-05-23 NOTE — Progress Notes (Deleted)
@Patient  ID: Harry Allen, male    DOB: 30-Oct-1940, 81 y.o.   MRN: [id number]  No chief complaint on file.   Referring provider: [name], MD  HPI: 81 year old male, former smoker. He smoked 1 pack per day from the age of [age] (53+ pack year hx). Quit in 2015. PMH COPD, stage 3 adenocarcinoma of right lung, OSA, allergic rhinitis, hyperlipidemia. Patient Dr. Lamonte Allen.  Previous [facility] pulmonary encounters: ROV 02/09/18 - Dr. 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  He is currently on observation.  He has documented sleep apnea but is not on CPAP.  He was seen here 11/19/2017 with an apparent acute exacerbation of his COPD.  At the time he was not on scheduled bronchodilators but agreed to start Spiriva.  Pulmonary function testing was ordered but was never done. He states that he is unsure whether the spiriva has helped him. His wife believes that he has improved on it. He is coughing up less mucous, having less wheeze. He uses Duoneb about 2x a day. Most recent CT scan of the chest 11/07/2017 reviewed by me.  This shows no evidence of local recurrence. Some perihilar consolidation and bronchiectatic change in a linear pattern associated with his radiation.  03/22/2018 Patient presents today for acute visit with complaints of cough with mucus production. Associated runny nose. Coughing up green/yellow mucus. Taking Delsym cough syrup and using neb every 6 hours. States that he had flu like symptoms 1.5 weeks ago. Reported chills, aches, weakness, sore throat and runny nose. Lost 7 lbs. Did not have much of an appetite, took in about half his normal caloric intake. Feels better, thinks he's over acute infection. Wifes states that he is "much, much, much better". Leaving tomorrow to speak at [employer] for work. He is then going to [place] on  November 2nd with his wife to stay at their apartment until Nov 23rd.    09/08/2019 Patient presents today for general follow-up. It was been over two years since he was last seen in our office. Accompanied by his wife. Reports increased shortness of breath when walking long distances and with stairs. He has not been consistently taking spiriva for the last 4-5 months and needs refill of his medication. He is walking every day with his wife and finds that when he does take this medication helpful. Reports that he has slowed down a lot. Notices some speaking trouble and a stutter. He has had this since childhood but feels his breathing affects his speech. No PFTs on file, he was unable to complete PFTs in the past. No active URI symptoms, cough, chest congestion, wheezing.    05/23/2021- Interim hx  Patient presents today for acute OV/cough. Hx COPD. Maintained on Spiriva Respimat 2.21mcg and prn ventolin/duoneb.    He reports having a chronic cough for the last several years.   Astelin nasal spray twice daily and Allegra 180mg  daily for allergic rhinitis symptoms    Imaging: CT chest 05/22/18- [facility] radiation changes right perihilar region. No evidence of recurrence of metastatic disease.   Allergies  Allergen Reactions   No Known Allergies     Immunization History  Administered Date(s) Administered   Influenza, High Dose Seasonal PF 02/27/2018, 02/18/2019   Influenza-Unspecified 02/26/2016, 05/08/2017   PFIZER(Purple Top)SARS-COV-2 Vaccination 06/10/2019, 07/01/2019   Pneumococcal Conjugate-13 05/22/2017    Past Medical History:  Diagnosis  Date   Adenocarcinoma of right lung, stage 3 ([place]) 12/19/2016   Cancer ([facility])    lung cancer   COPD (chronic obstructive pulmonary disease) ([facility])    H/O hernia repair    Sleep apnea     Tobacco History: Social History   Tobacco Use  Smoking Status Former   Packs/day: 1.00   Years: 50.00   Pack years: 50.00   Types: Cigarettes   Quit  date: 11/19/2013   Years since quitting: 7.5  Smokeless Tobacco Never   Counseling given: Not Answered   Outpatient Medications Prior to Visit  Medication Sig Dispense Refill   albuterol (PROVENTIL HFA;VENTOLIN HFA) 108 (90 Base) MCG/ACT inhaler Inhale 2 puffs into the lungs every 4 (four) hours as needed for wheezing or shortness of breath. (Patient not taking: Reported on 05/26/2018) 1 Inhaler 5   ipratropium-albuterol (DUONEB) 0.5-2.5 (3) MG/3ML SOLN Take 3 mLs by nebulization every 6 (six) hours as needed. And as needed 360 mL 5   Tiotropium Bromide Monohydrate (SPIRIVA RESPIMAT) 2.5 MCG/ACT AERS Inhale 2 puffs into the lungs daily. 4 g 11   No facility-administered medications prior to visit.      Review of Systems  Review of Systems   Physical Exam  There were no vitals taken for this visit. Physical Exam   Lab Results:  CBC    Component Value Date/Time   WBC 8.7 05/26/2018 1514   WBC 6.8 02/26/2018 1304   RBC 4.90 05/26/2018 1514   HGB 14.7 05/26/2018 1514   HGB 12.2 (L) 05/16/2017 1340   HCT 44.2 05/26/2018 1514   HCT 36.9 (L) 05/16/2017 1340   PLT 261 05/26/2018 1514   PLT 320 05/16/2017 1340   MCV 90.2 05/26/2018 1514   MCV 94.7 05/16/2017 1340   MCH 30.0 05/26/2018 1514   MCHC 33.3 05/26/2018 1514   RDW 13.2 05/26/2018 1514   RDW 14.7 (H) 05/16/2017 1340   LYMPHSABS 1.4 05/26/2018 1514   LYMPHSABS 0.8 (L) 05/16/2017 1340   MONOABS 0.7 05/26/2018 1514   MONOABS 0.8 05/16/2017 1340   EOSABS 0.6 (H) 05/26/2018 1514   EOSABS 0.3 05/16/2017 1340   BASOSABS 0.1 05/26/2018 1514   BASOSABS 0.1 05/16/2017 1340    BMET    Component Value Date/Time   NA 137 05/26/2018 1514   NA 137 05/16/2017 1340   K 4.8 05/26/2018 1514   K 4.2 05/16/2017 1340   CL 102 05/26/2018 1514   CO2 23 05/26/2018 1514   CO2 26 05/16/2017 1340   GLUCOSE 68 (L) 05/26/2018 1514   GLUCOSE 80 05/16/2017 1340   BUN 11 05/26/2018 1514   BUN 10.9 05/16/2017 1340   CREATININE 0.83  05/26/2018 1514   CREATININE 0.8 05/16/2017 1340   CALCIUM 9.6 05/26/2018 1514   CALCIUM 9.3 05/16/2017 1340   GFRNONAA >60 05/26/2018 1514   GFRAA >60 05/26/2018 1514    BNP No results found for: BNP  ProBNP No results found for: PROBNP  Imaging: No results found.   Assessment & Plan:   No problem-specific Assessment & Plan notes found for this encounter.     Martyn Ehrich, NP 05/23/2021

## 2022-02-17 DEATH — deceased
